# Patient Record
Sex: Female | Born: 1975 | Race: White | Hispanic: No | Marital: Married | Smoking: Never smoker
Health system: Southern US, Community
[De-identification: ages and names within clinical notes are randomized; demographics above are authoritative.]

## PROBLEM LIST (undated history)

## (undated) DIAGNOSIS — N926 Irregular menstruation, unspecified: Secondary | ICD-10-CM

## (undated) DIAGNOSIS — K589 Irritable bowel syndrome without diarrhea: Secondary | ICD-10-CM

## (undated) DIAGNOSIS — J302 Other seasonal allergic rhinitis: Secondary | ICD-10-CM

## (undated) DIAGNOSIS — E669 Obesity, unspecified: Secondary | ICD-10-CM

## (undated) HISTORY — DX: Irregular menstruation, unspecified: N92.6

## (undated) HISTORY — PX: LASIK: SHX215

## (undated) HISTORY — PX: CHOLECYSTECTOMY: SHX55

## (undated) HISTORY — DX: Obesity, unspecified: E66.9

---

## 2005-09-14 ENCOUNTER — Ambulatory Visit: Payer: Self-pay | Admitting: Family Medicine

## 2011-10-04 DIAGNOSIS — J309 Allergic rhinitis, unspecified: Secondary | ICD-10-CM | POA: Insufficient documentation

## 2011-10-04 DIAGNOSIS — R519 Headache, unspecified: Secondary | ICD-10-CM | POA: Insufficient documentation

## 2011-10-04 DIAGNOSIS — G4700 Insomnia, unspecified: Secondary | ICD-10-CM | POA: Insufficient documentation

## 2011-10-04 DIAGNOSIS — J302 Other seasonal allergic rhinitis: Secondary | ICD-10-CM | POA: Insufficient documentation

## 2012-05-19 ENCOUNTER — Encounter: Payer: Self-pay | Admitting: *Deleted

## 2012-05-19 ENCOUNTER — Emergency Department (INDEPENDENT_AMBULATORY_CARE_PROVIDER_SITE_OTHER)
Admission: EM | Admit: 2012-05-19 | Discharge: 2012-05-19 | Disposition: A | Discharge status: Home / Self Care | Payer: BC Managed Care – PPO | Source: Home / Self Care

## 2012-05-19 DIAGNOSIS — B351 Tinea unguium: Secondary | ICD-10-CM

## 2012-05-19 HISTORY — DX: Irritable bowel syndrome, unspecified: K58.9

## 2012-05-19 MED ORDER — TERBINAFINE HCL 250 MG PO TABS: 250.0000 mg | ORAL_TABLET | Freq: Every day | ORAL | Status: AC

## 2012-05-19 NOTE — ED Notes (Signed)
Patient c/o left great toe discoloration x 5 weeks. No injury, swelling pain, or drainage.

## 2012-05-19 NOTE — ED Provider Notes (Signed)
Agree with exam, assessment, and plan.   Gracey Tolle A Bronislaw Switzer, MD 05/19/12 2333 

## 2012-05-19 NOTE — ED Provider Notes (Signed)
History     CSN: [id number]  Arrival date & time 05/19/12  1402   First MD Initiated Contact with Patient 05/19/12 1407      Chief Complaint  Patient presents with  . Nail Problem    left great toe   HPI Toe nail fungus on R great toe x 5 weeks. Pt states that she had a pedicure done and noticed thickening of nail and color change 1-2 weeks later.  No purulent drainage, redness, swelling.  Pt states that she has been using OTC topical antifungals without any change in discoloration.  Discoloration has been mildly worsening.  Otherwise no issues with great toe.   Past Medical History  Diagnosis Date  . IBS (irritable bowel syndrome)     Past Surgical History  Procedure Date  . Lasik     Family History  Problem Relation Age of Onset  . Hypertension Mother   . Hyperlipidemia Father     History  Substance Use Topics  . Smoking status: Never Smoker   . Smokeless tobacco: Not on file  . Alcohol Use: No    OB History    Grav Para Term Preterm Abortions TAB SAB Ect Mult Living                  Review of Systems  All other systems reviewed and are negative.   See HPI, otherwise 12 point ROS negative.  Allergies  Codeine; Erythromycin; Penicillins; and Sulfa antibiotics  Home Medications   Current Outpatient Rx  Name Route Sig Dispense Refill  . LUBIPROSTONE 24 MCG PO CAPS Oral Take 24 mcg by mouth 2 (two) times daily with a meal.      BP 106/67  Pulse 66  Temp 98.6 F (37 C) (Oral)  Resp 14  Ht 5\' 7"  (1.702 m)  Wt 162 lb (73.483 kg)  BMI 25.37 kg/m2  SpO2 100%  Physical Exam  Constitutional: She appears well-developed and well-nourished.  HENT:  Head: Normocephalic.  Eyes: Conjunctivae are normal. Pupils are equal, round, and reactive to light.  Neck: Normal range of motion.  Cardiovascular: Normal rate and regular rhythm.   Pulmonary/Chest: Effort normal and breath sounds normal.  Abdominal: Soft. Bowel sounds are normal.  Genitourinary:  Vagina normal.  Musculoskeletal:       Feet:       L great toe toenail thickening and mild discoloration.     ED Course  Procedures (including critical care time)  Labs Reviewed - No data to display No results found.   1. Onychomycosis       MDM  L great toe toe nail involvement.  Will treat with oral terbinafine.  Reviewed LFTs from most recent physical exam at PCP 1 month ago. WNL.  Plan for follow up with PCP in 2-3 weeks for reevaluation.  General red flags reviewed.     The patient and/or caregiver has been counseled thoroughly with regard to treatment plan and/or medications prescribed including dosage, schedule, interactions, rationale for use, and possible side effects and they verbalize understanding. Diagnoses and expected course of recovery discussed and will return if not improved as expected or if the condition worsens. Patient and/or caregiver verbalized understanding.             Floydene Flock, MD 05/19/12 [phone]

## 2012-05-20 ENCOUNTER — Telehealth: Payer: Self-pay | Admitting: *Deleted

## 2012-06-27 LAB — HM PAP SMEAR: HM Pap smear: NEGATIVE

## 2013-01-13 DIAGNOSIS — Z9049 Acquired absence of other specified parts of digestive tract: Secondary | ICD-10-CM

## 2013-01-13 HISTORY — DX: Acquired absence of other specified parts of digestive tract: Z90.49

## 2014-08-12 ENCOUNTER — Emergency Department (INDEPENDENT_AMBULATORY_CARE_PROVIDER_SITE_OTHER)
Admission: EM | Admit: 2014-08-12 | Discharge: 2014-08-12 | Disposition: A | Discharge status: Home / Self Care | Payer: BC Managed Care – PPO | Source: Home / Self Care | Attending: Emergency Medicine | Admitting: Emergency Medicine

## 2014-08-12 ENCOUNTER — Emergency Department (INDEPENDENT_AMBULATORY_CARE_PROVIDER_SITE_OTHER): Payer: BC Managed Care – PPO

## 2014-08-12 ENCOUNTER — Encounter: Payer: Self-pay | Admitting: Emergency Medicine

## 2014-08-12 DIAGNOSIS — R05 Cough: Secondary | ICD-10-CM

## 2014-08-12 DIAGNOSIS — J209 Acute bronchitis, unspecified: Secondary | ICD-10-CM

## 2014-08-12 DIAGNOSIS — R059 Cough, unspecified: Secondary | ICD-10-CM

## 2014-08-12 DIAGNOSIS — M940 Chondrocostal junction syndrome [Tietze]: Secondary | ICD-10-CM

## 2014-08-12 HISTORY — DX: Other seasonal allergic rhinitis: J30.2

## 2014-08-12 MED ORDER — METHYLPREDNISOLONE ACETATE 80 MG/ML IJ SUSP
80.0000 mg | Freq: Once | INTRAMUSCULAR | Status: AC
  Administered 2014-08-12: 80 mg via INTRAMUSCULAR

## 2014-08-12 MED ORDER — BENZONATATE 200 MG PO CAPS: ORAL_CAPSULE | ORAL | Status: DC

## 2014-08-12 MED ORDER — FLUTICASONE PROPIONATE 50 MCG/ACT NA SUSP: NASAL | Status: DC

## 2014-08-12 MED ORDER — PREDNISONE (PAK) 10 MG PO TABS: ORAL_TABLET | ORAL | Status: DC

## 2014-08-12 MED ORDER — DOXYCYCLINE HYCLATE 100 MG PO CAPS: 100.0000 mg | ORAL_CAPSULE | Freq: Two times a day (BID) | ORAL | Status: DC

## 2014-08-12 NOTE — ED Provider Notes (Addendum)
CSN: [id number]     Arrival date & time 08/12/14  1708 History   First MD Initiated Contact with Patient 08/12/14 1713     Chief Complaint  Patient presents with  . Cough  . Nasal Congestion  . Fatigue  . Chest Pain  . Dizziness  . Code Stroke  . Night Sweats    HPI Reports 3 week history of cough, sharp pain in anterior chest when coughing, congestion, fatigue, headaches, dizziness.low-grade fever at times. No recent OTCs. Cough occasionally productive of scant sputum slightly discolored without blood. Mild discolored nasal rhinorrhea. Some pleuritic anterior chest discomfort as mild, sharp. Occasionally feels mild wheezing. No history of asthma or chronic lung disease. No exertional chest pain. No nausea or vomiting. No definite reflux symptoms. She tried Zantac without help. Tried Claritin without improvement. Past Medical History  Diagnosis Date  . IBS (irritable bowel syndrome)   . Seasonal allergies    Past Surgical History  Procedure Laterality Date  . Lasik    . Cholecystectomy     Family History  Problem Relation Age of Onset  . Hypertension Mother   . Hyperlipidemia Father    History  Substance Use Topics  . Smoking status: Never Smoker   . Smokeless tobacco: Not on file  . Alcohol Use: No   OB History    No data available     Review of Systems  All other systems reviewed and are negative.   Allergies  Codeine; Erythromycin; Penicillins; and Sulfa antibiotics  Home Medications   Prior to Admission medications   Medication Sig Start Date End Date Taking? Authorizing Provider  benzonatate (TESSALON) 200 MG capsule Take 1 every 8 hours as needed for cough. 08/12/14   Lajean Manesavid Massey, MD  doxycycline (VIBRAMYCIN) 100 MG capsule Take 1 capsule (100 mg total) by mouth 2 (two) times daily. 08/12/14   Lajean Manesavid Massey, MD  fluticasone Aleda Grana(FLONASE) 50 MCG/ACT nasal spray 1 or 2 sprays each nostril twice a day 08/12/14   Lajean Manesavid Massey, MD  lubiprostone (AMITIZA) 24 MCG  capsule Take 24 mcg by mouth 2 (two) times daily with a meal.    Historical Provider, MD  predniSONE (STERAPRED UNI-PAK) 10 MG tablet Take as directed for 6 days.--Take 6 on day 1, 5 on day 2, 4 on day 3, then 3 tablets on day 4, then 2 tablets on day 5, then 1 on day 6. 08/12/14   Lajean Manesavid Massey, MD   BP 119/72 mmHg  Pulse 100  Temp(Src) 98.6 F (37 C)  Resp 16  Ht 5' 6.5" (1.689 m)  Wt 165 lb (74.844 kg)  BMI 26.24 kg/m2  SpO2 100% Physical Exam  Constitutional: She is oriented to person, place, and time. She appears well-developed and well-nourished. No distress.  HENT:  Head: Normocephalic and atraumatic.  Right Ear: Tympanic membrane, external ear and ear canal normal.  Left Ear: Tympanic membrane, external ear and ear canal normal.  Nose: Mucosal edema and rhinorrhea present. Right sinus exhibits maxillary sinus tenderness. Left sinus exhibits maxillary sinus tenderness.  Mouth/Throat: Oropharynx is clear and moist. No oral lesions. No oropharyngeal exudate.  Eyes: Right eye exhibits no discharge. Left eye exhibits no discharge. No scleral icterus.  Neck: Neck supple.  Cardiovascular: Normal rate, regular rhythm and normal heart sounds.   Pulmonary/Chest: Effort normal. No respiratory distress. She has wheezes (few scattered late expiratory). She has rhonchi. She has no rales.  Lymphadenopathy:    She has no cervical adenopathy.  Neurological: She is  alert and oriented to person, place, and time.  Skin: Skin is warm and dry.  Psychiatric: She has a normal mood and affect.  Nursing note and vitals reviewed.   ED Course  Procedures (including critical care time) Labs Review Labs Reviewed - No data to display  Imaging Review Dg Chest 2 View  08/12/2014   CLINICAL DATA:  Three-week history of dry cough.  Nonsmoker.  EXAM: CHEST  2 VIEW  COMPARISON:  None.  FINDINGS: The heart size and mediastinal contours are within normal limits. Both lungs are clear. No pleural effusion or  pneumothorax. The visualized skeletal structures are unremarkable.  IMPRESSION: No active cardiopulmonary disease.   Electronically Signed   By: Amie Portlandavid  Ormond M.D.   On: 08/12/2014 17:53     MDM   1. Acute bronchitis with bronchospasm   2. Cough   3. Costochondritis    Treatment options discussed, as well as risks, benefits, alternatives. Patient voiced understanding and agreement with the following plans:  After discussion, she prefers Depo-Medrol 80 mg IM now. She prefers that I electronically send her prescriptions to her pharmacy, which I sent: Discharge Medication List as of 08/12/2014  6:25 PM    START taking these medications   Details  benzonatate (TESSALON) 200 MG capsule Take 1 every 8 hours as needed for cough., Normal    doxycycline (VIBRAMYCIN) 100 MG capsule Take 1 capsule (100 mg total) by mouth 2 (two) times daily., Starting 08/12/2014, Until Discontinued, Normal    fluticasone (FLONASE) 50 MCG/ACT nasal spray 1 or 2 sprays each nostril twice a day, Normal    predniSONE (STERAPRED UNI-PAK) 10 MG tablet Take as directed for 6 days.--Take 6 on day 1, 5 on day 2, 4 on day 3, then 3 tablets on day 4, then 2 tablets on day 5, then 1 on day 6., Normal       She declined any bronchodilator. We discussed other symptomatic care. Follow-up with your primary care doctor in 5-7 days if not improving, or sooner if symptoms become worse. Precautions discussed. Red flags discussed. Questions invited and answered. Patient voiced understanding and agreement.    Lajean Manesavid Massey, MD 08/12/14 1913  Lajean Manesavid Massey, MD 08/12/14 Prentice [id number]  Lajean Manesavid Massey, MD 08/12/14 [phone]

## 2014-08-12 NOTE — ED Notes (Signed)
Reports 3 week history of cough, pain in chest when coughing, congestion, fatigue, headaches, dizziness. No fever. No recent OTCs.

## 2014-08-21 ENCOUNTER — Encounter: Payer: Self-pay | Admitting: *Deleted

## 2014-08-21 ENCOUNTER — Emergency Department (INDEPENDENT_AMBULATORY_CARE_PROVIDER_SITE_OTHER)
Admission: EM | Admit: 2014-08-21 | Discharge: 2014-08-21 | Disposition: A | Discharge status: Home / Self Care | Payer: BC Managed Care – PPO | Source: Home / Self Care | Attending: Family Medicine | Admitting: Family Medicine

## 2014-08-21 DIAGNOSIS — H8110 Benign paroxysmal vertigo, unspecified ear: Secondary | ICD-10-CM

## 2014-08-21 MED ORDER — MECLIZINE HCL 25 MG PO TABS: ORAL_TABLET | ORAL | Status: DC

## 2014-08-21 NOTE — Discharge Instructions (Signed)
May add Sudafed for sinus congestion.    May use Afrin nasal spray (or generic oxymetazoline) twice daily for about 5 days.  Also recommend using saline nasal spray several times daily and saline nasal irrigation (AYR is a common brand).  Continue Flonase nasal spray. Finish antibiotic. Stop all antihistamines for now, and other non-prescription cough/cold preparations.

## 2014-08-21 NOTE — ED Provider Notes (Signed)
CSN: [id number]     Arrival date & time 08/21/14  1743 History   First MD Initiated Contact with Patient 08/21/14 1814     Chief Complaint  Patient presents with  . Dizziness  . Headache      HPI Comments: Patient was prescribed doxycycline and prednisone 10 days ago for bronchitis.  Her symptoms have generally improved, but over the past 48 hours she has felt dizzy (head spinning) with movement, and her left ear feels clogged.  She has had occasional nausea without vomiting.  She had left ear surgery two years ago.  No fevers, chills, and sweats  Patient is a 38 y.o. female presenting with dizziness. The history is provided by the patient.  Dizziness Quality:  Head spinning and imbalance Severity:  Mild Onset quality:  Sudden Duration:  2 days Timing:  Intermittent Progression:  Unchanged Chronicity:  New Context: bending over and head movement   Context: not with ear pain   Relieved by:  Nothing Ineffective treatments:  None tried Associated symptoms: headaches and nausea   Associated symptoms: no hearing loss, no shortness of breath, no syncope, no tinnitus, no vision changes, no vomiting and no weakness     Past Medical History  Diagnosis Date  . IBS (irritable bowel syndrome)   . Seasonal allergies    Past Surgical History  Procedure Laterality Date  . Lasik    . Cholecystectomy     Family History  Problem Relation Age of Onset  . Hypertension Mother   . Hyperlipidemia Father    History  Substance Use Topics  . Smoking status: Never Smoker   . Smokeless tobacco: Not on file  . Alcohol Use: Yes   OB History    No data available     Review of Systems  HENT: Negative for hearing loss and tinnitus.   Respiratory: Negative for shortness of breath.   Cardiovascular: Negative for syncope.  Gastrointestinal: Positive for nausea. Negative for vomiting.  Neurological: Positive for dizziness and headaches.  All other systems reviewed and are negative.   Allergies  Codeine; Erythromycin; Penicillins; and Sulfa antibiotics  Home Medications   Prior to Admission medications   Medication Sig Start Date End Date Taking? Authorizing Provider  benzonatate (TESSALON) 200 MG capsule Take 1 every 8 hours as needed for cough. 08/12/14   Lajean Manesavid Massey, MD  doxycycline (VIBRAMYCIN) 100 MG capsule Take 1 capsule (100 mg total) by mouth  2 (two) times daily. 08/12/14   Lajean Manesavid Massey, MD  fluticasone Aleda Grana(FLONASE) 50 MCG/ACT nasal spray 1 or 2 sprays each nostril twice a day 08/12/14   Lajean Manesavid Massey, MD  lubiprostone (AMITIZA) 24 MCG capsule Take 24 mcg by mouth 2 (two) times daily with a meal.    Historical Provider, MD  meclizine (ANTIVERT) 25 MG tablet Take one tab by mouth 2 or 3 times daily as needed for dizziness 08/21/14   Lattie HawStephen A Beese, MD  predniSONE (STERAPRED UNI-PAK) 10 MG tablet Take as directed for 6 days.--Take 6 on day 1, 5 on day 2, 4 on day 3, then 3 tablets on day 4, then 2 tablets on day 5, then 1 on day 6. 08/12/14   Lajean Manesavid Massey, MD   BP 117/82 mmHg  Pulse 92  Temp(Src) 98.3 F (36.8 C) (Oral)  Resp 18  Ht 5\' 7"  (1.702 m)  Wt 173 lb (78.472 kg)  BMI 27.09 kg/m2  SpO2 100% Physical Exam Nursing notes and Vital Signs reviewed. Appearance:  Patient appears healthy, stated age, and in no acute distress Eyes:  Pupils are equal, round, and reactive to light and accomodation.  Extraocular movement is intact.  Conjunctivae are not inflamed.  Fundi benign.  Slight nystagmus present on lateral gaze. Ears:  Canals normal.  Right tympanic membrane normal.  Left tympanic membrane scarred and retracted. Nose:  Mildly congested turbinates.  No sinus tenderness.    Pharynx:  Normal Neck:  Supple.  No adenopathy Skin:  No rash present.    Neurologic:  Cranial nerves 2 through 12 are normal. Gait and station are normal.   ED Course  Procedures  None  Labs Reviewed -  Tympanogram normal bilaterally       MDM   1. Benign paroxysmal positional vertigo, unspecified laterality    Begin Antivert May add Sudafed for sinus congestion.    May use Afrin nasal spray (or generic oxymetazoline) twice daily for about 5 days.  Also recommend using saline nasal spray several times daily and saline nasal irrigation (AYR is a common brand).  Continue Flonase nasal spray. Finish antibiotic. Stop all antihistamines for now, and  other non-prescription cough/cold preparations. Followup ENT if not improving    Lattie HawStephen A Beese, MD 08/23/14 1515

## 2014-08-21 NOTE — ED Notes (Signed)
Pt c/o HA, dizziness, and LT ear echoing x 2 days. She reports a hx of vertigo.

## 2015-05-24 ENCOUNTER — Encounter: Payer: Self-pay | Admitting: Osteopathic Medicine

## 2015-05-24 ENCOUNTER — Ambulatory Visit (INDEPENDENT_AMBULATORY_CARE_PROVIDER_SITE_OTHER): Payer: BC Managed Care – PPO | Admitting: Osteopathic Medicine

## 2015-05-24 VITALS — BP 93/60 | HR 68 | Ht 66.0 in | Wt 186.0 lb

## 2015-05-24 DIAGNOSIS — L680 Hirsutism: Secondary | ICD-10-CM | POA: Diagnosis not present

## 2015-05-24 DIAGNOSIS — N951 Menopausal and female climacteric states: Secondary | ICD-10-CM

## 2015-05-24 DIAGNOSIS — N926 Irregular menstruation, unspecified: Secondary | ICD-10-CM | POA: Diagnosis not present

## 2015-05-24 DIAGNOSIS — R5383 Other fatigue: Secondary | ICD-10-CM

## 2015-05-24 DIAGNOSIS — R232 Flushing: Secondary | ICD-10-CM

## 2015-05-24 DIAGNOSIS — S83209A Unspecified tear of unspecified meniscus, current injury, unspecified knee, initial encounter: Secondary | ICD-10-CM | POA: Insufficient documentation

## 2015-05-24 DIAGNOSIS — R635 Abnormal weight gain: Secondary | ICD-10-CM | POA: Diagnosis not present

## 2015-05-24 DIAGNOSIS — L708 Other acne: Secondary | ICD-10-CM

## 2015-05-24 MED ORDER — BENZOYL PEROXIDE 5 % EX LOTN: TOPICAL_LOTION | CUTANEOUS | Status: DC

## 2015-05-24 NOTE — Progress Notes (Signed)
HPI:  Kathleen Ortega is a 39 y.o. female who presents to [facility]  today for weight gain 34 lbs over past year despite reportedly healthy diet and exercise habits.  Assoc symptoms: C/o hair growth on breast/chest/neck, acne, fatigue  Onset/duration: ongoing for about 6 mos. Mirena removed 02/2015 - symptoms seem to have gotten worse since Mirena out. Made worse by: Mirena out, Periods seem erratic since Mirena out but usually between 22-31 days but has only had 2 periods, heavy, last 2 - 3 days. She had no periods on Mirena. Husband has vasectomy. Helped by: nothing. On Doxy for acne, doesn't seem to be helping.  No FH early menopause.   Previously worked up by other PCP but was unhappy with communication issues re: lab results and wants to establish care here.     Past medical, social and family history reviewed: Past Medical History  Diagnosis Date  . IBS (irritable bowel syndrome)   . Seasonal allergies    Past Surgical History  Procedure Laterality Date  . Lasik    . Cholecystectomy     Social History  Substance Use Topics  . Smoking status: Never Smoker   . Smokeless tobacco: Not on file  . Alcohol Use: Yes   Family History  Problem Relation Age of Onset  . Hypertension Mother   . Hyperlipidemia Father   . Alcohol abuse Brother   . Diabetes Maternal Grandmother   . Stroke Maternal Grandfather   . Cancer Paternal Grandmother     breast    Current Outpatient Prescriptions  Medication Sig Dispense Refill  . ALPRAZolam (XANAX) 0.5 MG tablet Take 0.5 mg by mouth as needed for anxiety.    . minocycline (DYNACIN) 50 MG tablet Take 50 mg by mouth daily.    . Benzoyl Peroxide 5 % lotion APPLY TOPICALLY TO AFFECTED AREA EVERY NIGHT, WASH OFF IN MORNING. NOTE - MAY BLEACH CLOTHING/LINENS 30 mL 0   No current facility-administered medications for this visit.   Allergies  Allergen Reactions  . Codeine   . Erythromycin   . Penicillins    . Sulfa Antibiotics      Review of Systems: CONSTITUTIONAL: Neg fever/chills, (+) unintentional weight gain HEAD/EYES/EARS/NOSE: No headache/vision change or hearing change CARDIAC: No chest pain/pressure/palpitations, no orthopnea RESPIRATORY: No cough/shortness of breath/wheeze GASTROINTESTINAL: No nausea/vomiting/abdominal pain/blood in stool/diarrhea/constipation MUSCULOSKELETAL: No myalgia/arthralgia GENITOURINARY: No incontinence, No abnormal genital bleeding/discharge, thinks periods are too close together SKIN: No rash/wounds/concerning lesions. (+) acne and hair grown as per HPI HEM/ONC: No easy bruising/bleeding, no abnormal lymph node PSYCHIATRIC: No concerns with depression/anxiety or sleep problems  ENDOCRINE: No heat/cold intolerance, no DM or Thyroid problems, (+) night sweats occasionally   Exam:  BP 93/60 mmHg  Pulse 68  Ht  (1.676 m)  Wt 186 lb (84.369 kg)  BMI 30.04 kg/m2  LMP 05/24/2015 Constitutional: VSS, see above. General Appearance: alert, well-developed, well-nourished, NAD Eyes: Normal lids and conjunctive, non-icteric sclera, PERRLA Ears, Nose, Mouth, Throat: Normal external inspection ears/nares/mouth/lips/gums, Normal TM bilaterally, MMM, posterior pharynx without erythema/exudate Neck: No masses, trachea midline. No thyroid enlargement/tenderness/mass appreciated Respiratory: Normal respiratory effort. No dullness/hyper-resonance to percussion. Breath sounds normal, no wheeze/rhonchi/rales Cardiovascular: S1/S2 normal, no murmur/rub/gallop auscultated. No carotid bruit or JVD. No abdominal aortic bruit. Pedal pulse II/IV bilaterally DP and PT. No lower extremity edema. Gastrointestinal: Nontender, no masses. No hepatomegaly, no splenomegaly. No hernia appreciated. Rectal exam deferred.  Musculoskeletal: Gait normal. No clubbing/cyanosis of digits.  Neurological:  No cranial nerve deficit on limited exam. Motor and sensation intact and  symmetric Skin: warm, dry, no rashes/ulcers, (+) acne vulgaris on face, chest, shoulders - mild/moderate. No nodules/induration. No striae or abnormal fat deposits.  Psychiatric: Normal judgment/insight. Normal mood and affect. Oriented x3.    No results found for this or any previous visit (from the past 72 hour(s)). No results found. Lab results reviewed from [facility] 05/09/15: endocrine labs were done in afternoon but all in normal range. CMP, CBC, Lipid, TSH, Insulin, Iron/anemia profile, adrenal labs, all WNL. Vit D mild lower.     ASSESSMENT/PLAN:  Weight gain - Plan: labs as below. Diet/exercise counseling provided   Hirsutism - Plan: DHEA-sulfate, 17-Hydroxyprogesterone, Prolactin, Testosterone, Free, Total, SHBG  Other fatigue - labs reviewed, normal TSH and CBC  Irregular menstrual cycle - Plan: DHEA-sulfate, FSH/LH, US Pelvis Complete, Pregnancy, urine  Hot flashes - Plan: DHEA-sulfate, FSH/LH  Other acne - Plan: Benzoyl Peroxide 5 % lotion, can continue Doxy as well      Note: Consider PCOS - [place] Criteria (2 of the following) Oligo/anovulation in absence of other cause - difficult to determine given she has only had 2 periods since removal of Mirena.  Hypoerandrogenism (clinical or biochemical) - (+) w/ acne, hirsutism but (-) labs will recollect and patient instructed to have these labs drawn close as possible to 8:00 am Polycystic ovaries by [place] - WILL ORDER  Note: low suspicion for Cushing's or other adrenal disorder given symptoms

## 2015-05-24 NOTE — Patient Instructions (Signed)
Preventive Care for Adults A healthy lifestyle and preventive care can promote health and wellness. Preventive health guidelines for women include the following key practices.  A routine yearly physical is a good way to check with your health care provider about your health and preventive screening. It is a chance to share any concerns and updates on your health and to receive a thorough exam.  Visit your dentist for a routine exam and preventive care every 6 months. Brush your teeth twice a day and floss once a day. Good oral hygiene prevents tooth decay and gum disease.  The frequency of eye exams is based on your age, health, family medical history, use of contact lenses, and other factors. Follow your health care provider's recommendations for frequency of eye exams.  Eat a healthy diet. Foods like vegetables, fruits, whole grains, low-fat dairy products, and lean protein foods contain the nutrients you need without too many calories. Decrease your intake of foods high in solid fats, added sugars, and salt. Eat the right amount of calories for you.Get information about a proper diet from your health care provider, if necessary.  Regular physical exercise is one of the most important things you can do for your health. Most adults should get at least 150 minutes of moderate-intensity exercise (any activity that increases your heart rate and causes you to sweat) each week. In addition, most adults need muscle-strengthening exercises on 2 or more days a week.  Maintain a healthy weight. The body mass index (BMI) is a screening tool to identify possible weight problems. It provides an estimate of body fat based on height and weight. Your health care provider can find your BMI and can help you achieve or maintain a healthy weight.For adults 20 years and older:  A BMI below 18.5 is considered underweight.  A BMI of 18.5 to 24.9 is normal.  A BMI of 25 to 29.9 is considered overweight.  A BMI of  30 and above is considered obese.  Maintain normal blood lipids and cholesterol levels by exercising and minimizing your intake of saturated fat. Eat a balanced diet with plenty of fruit and vegetables. Blood tests for lipids and cholesterol should begin at age 7 and be repeated every 5 years. If your lipid or cholesterol levels are high, you are over 50, or you are at high risk for heart disease, you may need your cholesterol levels checked more frequently.Ongoing high lipid and cholesterol levels should be treated with medicines if diet and exercise are not working.  If you smoke, find out from your health care provider how to quit. If you do not use tobacco, do not start.  Lung cancer screening is recommended for adults aged 24-80 years who are at high risk for developing lung cancer because of a history of smoking. A yearly low-dose CT scan of the lungs is recommended for people who have at least a 30-pack-year history of smoking and are a current smoker or have quit within the past 15 years. A pack year of smoking is smoking an average of 1 pack of cigarettes a day for 1 year (for example: 1 pack a day for 30 years or 2 packs a day for 15 years). Yearly screening should continue until the smoker has stopped smoking for at least 15 years. Yearly screening should be stopped for people who develop a health problem that would prevent them from having lung cancer treatment.  If you are pregnant, do not drink alcohol. If you are breastfeeding,  be very cautious about drinking alcohol. If you are not pregnant and choose to drink alcohol, do not have more than 1 drink per day. One drink is considered to be 12 ounces (355 mL) of beer, 5 ounces (148 mL) of wine, or 1.5 ounces (44 mL) of liquor.  Avoid use of street drugs. Do not share needles with anyone. Ask for help if you need support or instructions about stopping the use of drugs.  High blood pressure causes heart disease and increases the risk of  stroke. Your blood pressure should be checked at least every 1 to 2 years. Ongoing high blood pressure should be treated with medicines if weight loss and exercise do not work.  If you are 26-3 years old, ask your health care provider if you should take aspirin to prevent strokes.  Diabetes screening involves taking a blood sample to check your fasting blood sugar level. This should be done once every 3 years, after age 52, if you are within normal weight and without risk factors for diabetes. Testing should be considered at a younger age or be carried out more frequently if you are overweight and have at least 1 risk factor for diabetes.  Breast cancer screening is essential preventive care for women. You should practice "breast self-awareness." This means understanding the normal appearance and feel of your breasts and may include breast self-examination. Any changes detected, no matter how small, should be reported to a health care provider. Women in their 62s and 30s should have a clinical breast exam (CBE) by a health care provider as part of a regular health exam every 1 to 3 years. After age 3, women should have a CBE every year. Starting at age 88, women should consider having a mammogram (breast X-ray test) every year. Women who have a family history of breast cancer should talk to their health care provider about genetic screening. Women at a high risk of breast cancer should talk to their health care providers about having an MRI and a mammogram every year.  Breast cancer gene (BRCA)-related cancer risk assessment is recommended for women who have family members with BRCA-related cancers. BRCA-related cancers include breast, ovarian, tubal, and peritoneal cancers. Having family members with these cancers may be associated with an increased risk for harmful changes (mutations) in the breast cancer genes BRCA1 and BRCA2. Results of the assessment will determine the need for genetic counseling and  BRCA1 and BRCA2 testing.  Routine pelvic exams to screen for cancer are no longer recommended for nonpregnant women who are considered low risk for cancer of the pelvic organs (ovaries, uterus, and vagina) and who do not have symptoms. Ask your health care provider if a screening pelvic exam is right for you.  If you have had past treatment for cervical cancer or a condition that could lead to cancer, you need Pap tests and screening for cancer for at least 20 years after your treatment. If Pap tests have been discontinued, your risk factors (such as having a new sexual partner) need to be reassessed to determine if screening should be resumed. Some women have medical problems that increase the chance of getting cervical cancer. In these cases, your health care provider may recommend more frequent screening and Pap tests.  The HPV test is an additional test that may be used for cervical cancer screening. The HPV test looks for the virus that can cause the cell changes on the cervix. The cells collected during the Pap test can be  tested for HPV. The HPV test could be used to screen women aged 38 years and older, and should be used in women of any age who have unclear Pap test results. After the age of 79, women should have HPV testing at the same frequency as a Pap test.  Colorectal cancer can be detected and often prevented. Most routine colorectal cancer screening begins at the age of 101 years and continues through age 70 years. However, your health care provider may recommend screening at an earlier age if you have risk factors for colon cancer. On a yearly basis, your health care provider may provide home test kits to check for hidden blood in the stool. Use of a small camera at the end of a tube, to directly examine the colon (sigmoidoscopy or colonoscopy), can detect the earliest forms of colorectal cancer. Talk to your health care provider about this at age 50, when routine screening begins. Direct  exam of the colon should be repeated every 5-10 years through age 74 years, unless early forms of pre-cancerous polyps or small growths are found.  People who are at an increased risk for hepatitis B should be screened for this virus. You are considered at high risk for hepatitis B if:  You were born in a country where hepatitis B occurs often. Talk with your health care provider about which countries are considered high risk.  Your parents were born in a high-risk country and you have not received a shot to protect against hepatitis B (hepatitis B vaccine).  You have HIV or AIDS.  You use needles to inject street drugs.  You live with, or have sex with, someone who has hepatitis B.  You get hemodialysis treatment.  You take certain medicines for conditions like cancer, organ transplantation, and autoimmune conditions.  Hepatitis C blood testing is recommended for all people born from [birth year] through 1965 and any individual with known risks for hepatitis C.  Practice safe sex. Use condoms and avoid high-risk sexual practices to reduce the spread of sexually transmitted infections (STIs). STIs include gonorrhea, chlamydia, syphilis, trichomonas, herpes, HPV, and human immunodeficiency virus (HIV). Herpes, HIV, and HPV are viral illnesses that have no cure. They can result in disability, cancer, and death.  You should be screened for sexually transmitted illnesses (STIs) including gonorrhea and chlamydia if:  You are sexually active and are younger than 24 years.  You are older than 24 years and your health care provider tells you that you are at risk for this type of infection.  Your sexual activity has changed since you were last screened and you are at an increased risk for chlamydia or gonorrhea. Ask your health care provider if you are at risk.  If you are at risk of being infected with HIV, it is recommended that you take a prescription medicine daily to prevent HIV infection. This is  called preexposure prophylaxis (PrEP). You are considered at risk if:  You are a heterosexual woman, are sexually active, and are at increased risk for HIV infection.  You take drugs by injection.  You are sexually active with a partner who has HIV.  Talk with your health care provider about whether you are at high risk of being infected with HIV. If you choose to begin PrEP, you should first be tested for HIV. You should then be tested every 3 months for as long as you are taking PrEP.  Osteoporosis is a disease in which the bones lose minerals and strength  with aging. This can result in serious bone fractures or breaks. The risk of osteoporosis can be identified using a bone density scan. Women ages 26 years and over and women at risk for fractures or osteoporosis should discuss screening with their health care providers. Ask your health care provider whether you should take a calcium supplement or vitamin D to reduce the rate of osteoporosis.  Menopause can be associated with physical symptoms and risks. Hormone replacement therapy is available to decrease symptoms and risks. You should talk to your health care provider about whether hormone replacement therapy is right for you.  Use sunscreen. Apply sunscreen liberally and repeatedly throughout the day. You should seek shade when your shadow is shorter than you. Protect yourself by wearing long sleeves, pants, a wide-brimmed hat, and sunglasses year round, whenever you are outdoors.  Once a month, do a whole body skin exam, using a mirror to look at the skin on your back. Tell your health care provider of new moles, moles that have irregular borders, moles that are larger than a pencil eraser, or moles that have changed in shape or color.  Stay current with required vaccines (immunizations).  Influenza vaccine. All adults should be immunized every year.  Tetanus, diphtheria, and acellular pertussis (Td, Tdap) vaccine. Pregnant women should  receive 1 dose of Tdap vaccine during each pregnancy. The dose should be obtained regardless of the length of time since the last dose. Immunization is preferred during the 27th-36th week of gestation. An adult who has not previously received Tdap or who does not know her vaccine status should receive 1 dose of Tdap. This initial dose should be followed by tetanus and diphtheria toxoids (Td) booster doses every 10 years. Adults with an unknown or incomplete history of completing a 3-dose immunization series with Td-containing vaccines should begin or complete a primary immunization series including a Tdap dose. Adults should receive a Td booster every 10 years.  Varicella vaccine. An adult without evidence of immunity to varicella should receive 2 doses or a second dose if she has previously received 1 dose. Pregnant females who do not have evidence of immunity should receive the first dose after pregnancy. This first dose should be obtained before leaving the health care facility. The second dose should be obtained 4-8 weeks after the first dose.  Human papillomavirus (HPV) vaccine. Females aged 13-26 years who have not received the vaccine previously should obtain the 3-dose series. The vaccine is not recommended for use in pregnant females. However, pregnancy testing is not needed before receiving a dose. If a female is found to be pregnant after receiving a dose, no treatment is needed. In that case, the remaining doses should be delayed until after the pregnancy. Immunization is recommended for any person with an immunocompromised condition through the age of 19 years if she did not get any or all doses earlier. During the 3-dose series, the second dose should be obtained 4-8 weeks after the first dose. The third dose should be obtained 24 weeks after the first dose and 16 weeks after the second dose.  Zoster vaccine. One dose is recommended for adults aged 35 years or older unless certain conditions are  present.  Measles, mumps, and rubella (MMR) vaccine. Adults born before [birth year] generally are considered immune to measles and mumps. Adults born in [birth year] or later should have 1 or more doses of MMR vaccine unless there is a contraindication to the vaccine or there is laboratory evidence of immunity to  each of the three diseases. A routine second dose of MMR vaccine should be obtained at least 28 days after the first dose for students attending postsecondary schools, health care workers, or international travelers. People who received inactivated measles vaccine or an unknown type of measles vaccine during 1963-1967 should receive 2 doses of MMR vaccine. People who received inactivated mumps vaccine or an unknown type of mumps vaccine before 1979 and are at high risk for mumps infection should consider immunization with 2 doses of MMR vaccine. For females of childbearing age, rubella immunity should be determined. If there is no evidence of immunity, females who are not pregnant should be vaccinated. If there is no evidence of immunity, females who are pregnant should delay immunization until after pregnancy. Unvaccinated health care workers born before [birth year] who lack laboratory evidence of measles, mumps, or rubella immunity or laboratory confirmation of disease should consider measles and mumps immunization with 2 doses of MMR vaccine or rubella immunization with 1 dose of MMR vaccine.  Pneumococcal 13-valent conjugate (PCV13) vaccine. When indicated, a person who is uncertain of her immunization history and has no record of immunization should receive the PCV13 vaccine. An adult aged 64 years or older who has certain medical conditions and has not been previously immunized should receive 1 dose of PCV13 vaccine. This PCV13 should be followed with a dose of pneumococcal polysaccharide (PPSV23) vaccine. The PPSV23 vaccine dose should be obtained at least 8 weeks after the dose of PCV13 vaccine. An adult aged 30  years or older who has certain medical conditions and previously received 1 or more doses of PPSV23 vaccine should receive 1 dose of PCV13. The PCV13 vaccine dose should be obtained 1 or more years after the last PPSV23 vaccine dose.  Pneumococcal polysaccharide (PPSV23) vaccine. When PCV13 is also indicated, PCV13 should be obtained first. All adults aged 12 years and older should be immunized. An adult younger than age 65 years who has certain medical conditions should be immunized. Any person who resides in a nursing home or long-term care facility should be immunized. An adult smoker should be immunized. People with an immunocompromised condition and certain other conditions should receive both PCV13 and PPSV23 vaccines. People with human immunodeficiency virus (HIV) infection should be immunized as soon as possible after diagnosis. Immunization during chemotherapy or radiation therapy should be avoided. Routine use of PPSV23 vaccine is not recommended for American Indians, [place]atives, or people younger than 65 years unless there are medical conditions that require PPSV23 vaccine. When indicated, people who have unknown immunization and have no record of immunization should receive PPSV23 vaccine. One-time revaccination 5 years after the first dose of PPSV23 is recommended for people aged 19-64 years who have chronic kidney failure, nephrotic syndrome, asplenia, or immunocompromised conditions. People who received 1-2 doses of PPSV23 before age 78 years should receive another dose of PPSV23 vaccine at age 41 years or later if at least 5 years have passed since the previous dose. Doses of PPSV23 are not needed for people immunized with PPSV23 at or after age 63 years.  Meningococcal vaccine. Adults with asplenia or persistent complement component deficiencies should receive 2 doses of quadrivalent meningococcal conjugate (MenACWY-D) vaccine. The doses should be obtained at least 2 months apart.  Microbiologists working with certain meningococcal bacteria, [employer] recruits, people at risk during an outbreak, and people who travel to or live in countries with a high rate of meningitis should be immunized. A first-year college student up through age  21 years who is living in a residence hall should receive a dose if she did not receive a dose on or after her 16th birthday. Adults who have certain high-risk conditions should receive one or more doses of vaccine.  Hepatitis A vaccine. Adults who wish to be protected from this disease, have certain high-risk conditions, work with hepatitis A-infected animals, work in hepatitis A research labs, or travel to or work in countries with a high rate of hepatitis A should be immunized. Adults who were previously unvaccinated and who anticipate close contact with an international adoptee during the first 60 days after arrival in the [place] from a country with a high rate of hepatitis A should be immunized.  Hepatitis B vaccine. Adults who wish to be protected from this disease, have certain high-risk conditions, may be exposed to blood or other infectious body fluids, are household contacts or sex partners of hepatitis B positive people, are clients or workers in certain care facilities, or travel to or work in countries with a high rate of hepatitis B should be immunized.  Haemophilus influenzae type b (Hib) vaccine. A previously unvaccinated person with asplenia or sickle cell disease or having a scheduled splenectomy should receive 1 dose of Hib vaccine. Regardless of previous immunization, a recipient of a hematopoietic stem cell transplant should receive a 3-dose series 6-12 months after her successful transplant. Hib vaccine is not recommended for adults with HIV infection. Preventive Services / Frequency Ages 64 to 68 years  Blood pressure check.** / Every 1 to 2 years.  Lipid and cholesterol check.** / Every 5 years beginning at age  22.  Clinical breast exam.** / Every 3 years for women in their 88s and 53s.  BRCA-related cancer risk assessment.** / For women who have family members with a BRCA-related cancer (breast, ovarian, tubal, or peritoneal cancers).  Pap test.** / Every 2 years from ages 90 through 51. Every 3 years starting at age 21 through age 56 or 3 with a history of 3 consecutive normal Pap tests.  HPV screening.** / Every 3 years from ages 24 through ages 1 to 46 with a history of 3 consecutive normal Pap tests.  Hepatitis C blood test.** / For any individual with known risks for hepatitis C.  Skin self-exam. / Monthly.  Influenza vaccine. / Every year.  Tetanus, diphtheria, and acellular pertussis (Tdap, Td) vaccine.** / Consult your health care provider. Pregnant women should receive 1 dose of Tdap vaccine during each pregnancy. 1 dose of Td every 10 years.  Varicella vaccine.** / Consult your health care provider. Pregnant females who do not have evidence of immunity should receive the first dose after pregnancy.  HPV vaccine. / 3 doses over 6 months, if 72 and younger. The vaccine is not recommended for use in pregnant females. However, pregnancy testing is not needed before receiving a dose.  Measles, mumps, rubella (MMR) vaccine.** / You need at least 1 dose of MMR if you were born in 1957 or later. You may also need a 2nd dose. For females of childbearing age, rubella immunity should be determined. If there is no evidence of immunity, females who are not pregnant should be vaccinated. If there is no evidence of immunity, females who are pregnant should delay immunization until after pregnancy.  Pneumococcal 13-valent conjugate (PCV13) vaccine.** / Consult your health care provider.  Pneumococcal polysaccharide (PPSV23) vaccine.** / 1 to 2 doses if you smoke cigarettes or if you have certain conditions.  Meningococcal vaccine.** /  1 dose if you are age 19 to 21 years and a first-year college  student living in a residence hall, or have one of several medical conditions, you need to get vaccinated against meningococcal disease. You may also need additional booster doses.  Hepatitis A vaccine.** / Consult your health care provider.  Hepatitis B vaccine.** / Consult your health care provider.  Haemophilus influenzae type b (Hib) vaccine.** / Consult your health care provider. Ages 40 to 64 years  Blood pressure check.** / Every 1 to 2 years.  Lipid and cholesterol check.** / Every 5 years beginning at age 20 years.  Lung cancer screening. / Every year if you are aged 55-80 years and have a 30-pack-year history of smoking and currently smoke or have quit within the past 15 years. Yearly screening is stopped once you have quit smoking for at least 15 years or develop a health problem that would prevent you from having lung cancer treatment.  Clinical breast exam.** / Every year after age 40 years.  BRCA-related cancer risk assessment.** / For women who have family members with a BRCA-related cancer (breast, ovarian, tubal, or peritoneal cancers).  Mammogram.** / Every year beginning at age 40 years and continuing for as long as you are in good health. Consult with your health care provider.  Pap test.** / Every 3 years starting at age 30 years through age 65 or 70 years with a history of 3 consecutive normal Pap tests.  HPV screening.** / Every 3 years from ages 30 years through ages 65 to 70 years with a history of 3 consecutive normal Pap tests.  Fecal occult blood test (FOBT) of stool. / Every year beginning at age 50 years and continuing until age 75 years. You may not need to do this test if you get a colonoscopy every 10 years.  Flexible sigmoidoscopy or colonoscopy.** / Every 5 years for a flexible sigmoidoscopy or every 10 years for a colonoscopy beginning at age 50 years and continuing until age 75 years.  Hepatitis C blood test.** / For all people born from 1945 through  1965 and any individual with known risks for hepatitis C.  Skin self-exam. / Monthly.  Influenza vaccine. / Every year.  Tetanus, diphtheria, and acellular pertussis (Tdap/Td) vaccine.** / Consult your health care provider. Pregnant women should receive 1 dose of Tdap vaccine during each pregnancy. 1 dose of Td every 10 years.  Varicella vaccine.** / Consult your health care provider. Pregnant females who do not have evidence of immunity should receive the first dose after pregnancy.  Zoster vaccine.** / 1 dose for adults aged 60 years or older.  Measles, mumps, rubella (MMR) vaccine.** / You need at least 1 dose of MMR if you were born in 1957 or later. You may also need a 2nd dose. For females of childbearing age, rubella immunity should be determined. If there is no evidence of immunity, females who are not pregnant should be vaccinated. If there is no evidence of immunity, females who are pregnant should delay immunization until after pregnancy.  Pneumococcal 13-valent conjugate (PCV13) vaccine.** / Consult your health care provider.  Pneumococcal polysaccharide (PPSV23) vaccine.** / 1 to 2 doses if you smoke cigarettes or if you have certain conditions.  Meningococcal vaccine.** / Consult your health care provider.  Hepatitis A vaccine.** / Consult your health care provider.  Hepatitis B vaccine.** / Consult your health care provider.  Haemophilus influenzae type b (Hib) vaccine.** / Consult your health care provider. Ages 65   years and over  Blood pressure check.** / Every 1 to 2 years.  Lipid and cholesterol check.** / Every 5 years beginning at age 22 years.  Lung cancer screening. / Every year if you are aged 73-80 years and have a 30-pack-year history of smoking and currently smoke or have quit within the past 15 years. Yearly screening is stopped once you have quit smoking for at least 15 years or develop a health problem that would prevent you from having lung cancer  treatment.  Clinical breast exam.** / Every year after age 4 years.  BRCA-related cancer risk assessment.** / For women who have family members with a BRCA-related cancer (breast, ovarian, tubal, or peritoneal cancers).  Mammogram.** / Every year beginning at age 40 years and continuing for as long as you are in good health. Consult with your health care provider.  Pap test.** / Every 3 years starting at age 9 years through age 34 or 91 years with 3 consecutive normal Pap tests. Testing can be stopped between 65 and 70 years with 3 consecutive normal Pap tests and no abnormal Pap or HPV tests in the past 10 years.  HPV screening.** / Every 3 years from ages 57 years through ages 64 or 45 years with a history of 3 consecutive normal Pap tests. Testing can be stopped between 65 and 70 years with 3 consecutive normal Pap tests and no abnormal Pap or HPV tests in the past 10 years.  Fecal occult blood test (FOBT) of stool. / Every year beginning at age 15 years and continuing until age 17 years. You may not need to do this test if you get a colonoscopy every 10 years.  Flexible sigmoidoscopy or colonoscopy.** / Every 5 years for a flexible sigmoidoscopy or every 10 years for a colonoscopy beginning at age 86 years and continuing until age 71 years.  Hepatitis C blood test.** / For all people born from [birth year] through 1966 and any individual with known risks for hepatitis C.  Osteoporosis screening.** / A one-time screening for women ages 83 years and over and women at risk for fractures or osteoporosis.  Skin self-exam. / Monthly.  Influenza vaccine. / Every year.  Tetanus, diphtheria, and acellular pertussis (Tdap/Td) vaccine.** / 1 dose of Td every 10 years.  Varicella vaccine.** / Consult your health care provider.  Zoster vaccine.** / 1 dose for adults aged 61 years or older.  Pneumococcal 13-valent conjugate (PCV13) vaccine.** / Consult your health care provider.  Pneumococcal  polysaccharide (PPSV23) vaccine.** / 1 dose for all adults aged 28 years and older.  Meningococcal vaccine.** / Consult your health care provider.  Hepatitis A vaccine.** / Consult your health care provider.  Hepatitis B vaccine.** / Consult your health care provider.  Haemophilus influenzae type b (Hib) vaccine.** / Consult your health care provider. ** Family history and personal history of risk and conditions may change your health care provider's recommendations. Document Released: 11/17/2001 Document Revised: 02/05/2014 Document Reviewed: 02/16/2011 [facility] Patient Information 2015 [place], [place]. This information is not intended to replace advice given to you by your health care provider. Make sure you discuss any questions you have with your health care provider.

## 2015-05-28 ENCOUNTER — Other Ambulatory Visit: Payer: Self-pay | Admitting: Osteopathic Medicine

## 2015-05-28 DIAGNOSIS — N926 Irregular menstruation, unspecified: Secondary | ICD-10-CM

## 2015-05-29 ENCOUNTER — Ambulatory Visit (INDEPENDENT_AMBULATORY_CARE_PROVIDER_SITE_OTHER): Payer: BC Managed Care – PPO

## 2015-05-29 DIAGNOSIS — N926 Irregular menstruation, unspecified: Secondary | ICD-10-CM | POA: Diagnosis not present

## 2015-05-29 LAB — DHEA-SULFATE: DHEA-SO4: 142 ug/dL (ref 23–266)

## 2015-05-29 LAB — TESTOSTERONE, FREE, TOTAL, SHBG
Sex Hormone Binding: 82 nmol/L (ref 17–124)
TESTOSTERONE-% FREE: 1 % (ref 0.4–2.4)
Testosterone, Free: 6.9 pg/mL — ABNORMAL HIGH (ref 0.6–6.8)
Testosterone: 71 ng/dL — ABNORMAL HIGH (ref 10–70)

## 2015-05-29 LAB — FSH/LH
FSH: 7.8 m[IU]/mL
LH: 5.1 m[IU]/mL

## 2015-05-29 LAB — PREGNANCY, URINE: Preg Test, Ur: NEGATIVE

## 2015-05-29 LAB — PROLACTIN: Prolactin: 7.4 ng/mL

## 2015-05-30 LAB — 17-HYDROXYPROGESTERONE: 17-OH-PROGESTERONE, LC/MS/MS: 26 ng/dL

## 2015-06-07 ENCOUNTER — Ambulatory Visit (INDEPENDENT_AMBULATORY_CARE_PROVIDER_SITE_OTHER): Payer: BC Managed Care – PPO | Admitting: Osteopathic Medicine

## 2015-06-07 ENCOUNTER — Encounter: Payer: Self-pay | Admitting: Osteopathic Medicine

## 2015-06-07 VITALS — BP 100/76 | HR 83 | Temp 98.3°F | Wt 187.0 lb

## 2015-06-07 DIAGNOSIS — N926 Irregular menstruation, unspecified: Secondary | ICD-10-CM

## 2015-06-07 DIAGNOSIS — L708 Other acne: Secondary | ICD-10-CM | POA: Diagnosis not present

## 2015-06-07 DIAGNOSIS — J069 Acute upper respiratory infection, unspecified: Secondary | ICD-10-CM

## 2015-06-07 DIAGNOSIS — R635 Abnormal weight gain: Secondary | ICD-10-CM

## 2015-06-07 DIAGNOSIS — R5383 Other fatigue: Secondary | ICD-10-CM | POA: Diagnosis not present

## 2015-06-07 MED ORDER — ETHYNODIOL DIAC-ETH ESTRADIOL 1-50 MG-MCG PO TABS: 1.0000 | ORAL_TABLET | Freq: Every day | ORAL | Status: DC

## 2015-06-07 NOTE — Progress Notes (Signed)
HPI:  Kathleen Ortega is a 39 y.o. female who presents to [facility] [place]  today for  RECHECK: weight gain 34 lbs over past year despite reportedly healthy diet and exercise habits.  NEW COMPLAINT: Head congestion and cough for 3 days.   LMP 8/28-30/16. C/o hair growth on breast/chest/neck, acne, fatigue ongoing for about 6 mos. Mirena removed 02/2015 - symptoms seem to have gotten worse since Mirena out ut no worse since last visit. Periods seem erratic since Mirena out but usually between 22-31 days but has only had 3 periods, heavy, last 2 - 3 days. She had no periods on Mirena. Husband has vasectomy. Helped by: nothing. No FH early menopause. Labs and imaging results reviewed as below. Benzoyl peroxide is helping acne on face    Past medical, social and family history reviewed: Past Medical History  Diagnosis Date  . IBS (irritable bowel syndrome)   . Seasonal allergies    Past Surgical History  Procedure Laterality Date  . Lasik    . Cholecystectomy     Social History  Substance Use Topics  . Smoking status: Never Smoker   . Smokeless tobacco: Not on file  . Alcohol Use: Yes   Family History  Problem Relation Age of Onset  . Hypertension Mother   . Hyperlipidemia Father   . Alcohol abuse Brother   . Diabetes Maternal Grandmother   . Stroke Maternal Grandfather   . Cancer Paternal Grandmother     breast    Current Outpatient Prescriptions  Medication Sig Dispense Refill  . ALPRAZolam (XANAX) 0.5 MG tablet Take 0.5 mg by mouth as needed for anxiety.    . Benzoyl Peroxide 5 % lotion APPLY TOPICALLY TO AFFECTED AREA EVERY NIGHT, WASH OFF IN MORNING. NOTE - MAY BLEACH CLOTHING/LINENS 30 mL 0  . minocycline (DYNACIN) 50 MG tablet Take 50 mg by mouth daily.     No current facility-administered medications for this visit.   Allergies  Allergen Reactions  . Codeine Hives and Swelling  . Erythromycin Hives and Swelling  . Penicillins Hives  and Swelling  . Sulfa Antibiotics Hives and Swelling  . Sulfamethoxazole-Trimethoprim Hives and Swelling     Review of Systems: CONSTITUTIONAL: Neg fever/chills, (+) unintentional weight gain HEAD/EYES/EARS/NOSE: No headache/vision change or hearing change positive sinus congestion 2 days,  CARDIAC: No chest pain/pressure/palpitations, no orthopnea RESPIRATORY: No cough/shortness of breath/wheeze GASTROINTESTINAL: No nausea/vomiting/abdominal pain/blood in stool/diarrhea/constipation MUSCULOSKELETAL: No myalgia/arthralgia GENITOURINARY: No incontinence, No abnormal genital bleeding/discharge, thinks periods are too close together SKIN: No rash/wounds/concerning lesions. (+) acne and hair grown as per HPI HEM/ONC: No easy bruising/bleeding, no abnormal lymph node PSYCHIATRIC: No concerns with depression/anxiety or sleep problems  ENDOCRINE: No heat/cold intolerance, no DM or Thyroid problems, (+) night sweats occasionally   Exam:  LMP 05/24/2015 Constitutional: VSS, see above. General Appearance: alert, well-developed, well-nourished, NAD Eyes: Normal lids and conjunctive, non-icteric sclera, PERRLA Ears, Nose, Mouth, Throat: Normal external inspection ears/nares/mouth/lips/gums, MMM, posterior pharynx without erythema/exudate Neck: No masses, trachea midline. No thyroid enlargement/tenderness/mass appreciated Respiratory: Normal respiratory effort. No dullness/hyper-resonance to percussion. Breath sounds normal, no wheeze/rhonchi/rales Cardiovascular: S1/S2 normal, no murmur/rub/gallop auscultated. No carotid bruit or JVD. No abdominal aortic bruit. Pedal pulse II/IV bilaterally DP and PT. No lower extremity edema. Skin: warm, dry, no rashes/ulcers, (+) acne vulgaris on face improved from previous exam, chest, shoulders - mild/moderate. No nodules/induration. No striae or abnormal fat deposits.  Psychiatric: Normal judgment/insight. Normal mood and affect. Oriented x3.  Lab results  reviewed from [facility] 05/09/15: endocrine labs were done in afternoon but all in normal range. CMP, CBC, Lipid, TSH, Insulin, Iron/anemia profile, adrenal labs, all WNL. Vit D mild lower.   Labs done at last visit 05/24/15 reviewed: Testosterone mildly elevated (71 while 70 is upper limit normal), otherwise normal.   ASSESSMENT/PLAN:  Irregular menstrual cycle - Plan: ethynodiol-ethinyl estradiol (ZOVIA) 1-50 MG-MCG tablet  Weight gain  Other fatigue  Other acne  Viral URI - supportive care and symptomatically treatment discussed, if no better in the next 5-7 days return to clinic, no antibiotic treatment necessary this time  Prescribed oral contraceptive pill with relatively higher estrogen concentration to help symptoms. Patient will trial this for 2-3 months and see if there is any improvement in symptoms. If her problems persist may consider repeating endocrine labs, patient was given the option to repeat testosterone levels to confirm but will hold off at this time.  Weight gain - Plan: labs normal. Diet/exercise counseling provided.Return to clinic to discuss /consider anti-obesity medication   Other acne - Plan: continue Benzoyl Peroxide 5 % lotion, can continue Doxy as well  Note: Consider PCOS - [place] Criteria (2 of the following) Oligo/anovulation in absence of other cause - difficult to determine given she has only had 2 periods since removal of Mirena.  Hypoerandrogenism (clinical or biochemical) - (+) w/ acne, hirsutism but (-)/borderline  labs  Polycystic ovaries by [place] - normal ovaries   Note: low suspicion for Cushing's or other adrenal disorder given symptoms

## 2015-06-07 NOTE — Patient Instructions (Signed)
Try pills for 2 - 3 months and we will recheck your symptoms at that time. Return to clinic sooner if any concerns.

## 2015-09-09 ENCOUNTER — Encounter: Payer: Self-pay | Admitting: Osteopathic Medicine

## 2015-09-09 ENCOUNTER — Ambulatory Visit (INDEPENDENT_AMBULATORY_CARE_PROVIDER_SITE_OTHER): Payer: BC Managed Care – PPO | Admitting: Osteopathic Medicine

## 2015-09-09 VITALS — BP 125/75 | HR 74 | Ht 66.0 in | Wt 192.0 lb

## 2015-09-09 DIAGNOSIS — R635 Abnormal weight gain: Secondary | ICD-10-CM

## 2015-09-09 DIAGNOSIS — N926 Irregular menstruation, unspecified: Secondary | ICD-10-CM | POA: Diagnosis not present

## 2015-09-09 DIAGNOSIS — R5383 Other fatigue: Secondary | ICD-10-CM | POA: Diagnosis not present

## 2015-09-09 DIAGNOSIS — L680 Hirsutism: Secondary | ICD-10-CM

## 2015-09-09 DIAGNOSIS — L708 Other acne: Secondary | ICD-10-CM | POA: Diagnosis not present

## 2015-09-09 MED ORDER — PHENTERMINE HCL 15 MG PO CAPS: 15.0000 mg | ORAL_CAPSULE | ORAL | Status: DC

## 2015-09-09 NOTE — Progress Notes (Signed)
HPI: Kathleen Ortega is a 39 y.o. female who [facility] [place]  today for chief complaint of:  Chief Complaint  Patient presents with  . Follow-up    PERIODS - STABLE - taking OCP, was having some spotting on BC but now using continuous (skipping placebo) and no issues so far.   WEIGHT -  WORSE - patient following with personal trainer, calorie counting, demonstrating any progress in terms of weight loss. Has gained a few pounds since last visit about 3 months ago.  MEDICATIONS - Xanax is on her list, I have not refilled this for her, she states that uses 1 or 2 pills a few times a month, no significant anxiety issues. She stopped taking the doxycycline for acne, is using the benzoyl peroxide as needed.   Past medical, social and family history reviewed: Past Medical History  Diagnosis Date  . IBS (irritable bowel syndrome)   . Seasonal allergies    Past Surgical History  Procedure Laterality Date  . Lasik    . Cholecystectomy     Social History  Substance Use Topics  . Smoking status: Never Smoker   . Smokeless tobacco: Not on file  . Alcohol Use: Yes   Family History  Problem Relation Age of Onset  . Hypertension Mother   . Hyperlipidemia Father   . Alcohol abuse Brother   . Diabetes Maternal Grandmother   . Stroke Maternal Grandfather   . Cancer Paternal Grandmother     breast    Current Outpatient Prescriptions  Medication Sig Dispense Refill  . ALPRAZolam (XANAX) 0.5 MG tablet Take 0.5 mg by mouth as needed for anxiety.    . Benzoyl Peroxide 5 % lotion APPLY TOPICALLY TO AFFECTED AREA EVERY NIGHT, WASH OFF IN MORNING. NOTE - MAY BLEACH CLOTHING/LINENS 30 mL 0  . ethynodiol-ethinyl estradiol (ZOVIA) 1-50 MG-MCG tablet Take 1 tablet by mouth daily. 1 Package 11  . minocycline (DYNACIN) 50 MG tablet Take 50 mg by mouth daily.     No current facility-administered medications for this visit.   Allergies  Allergen Reactions   . Codeine Hives and Swelling  . Erythromycin Hives and Swelling  . Penicillins Hives and Swelling  . Sulfa Antibiotics Hives and Swelling  . Sulfamethoxazole-Trimethoprim Hives and Swelling      Review of Systems: CONSTITUTIONAL:  No  fever, no chills, (+) unintentional weight changes HEAD/EYES/EARS/NOSE/THROAT: No headache, no vision change,  CARDIAC: No chest pain, no pressure/palpitations, no orthopnea RESPIRATORY: No  cough, No  shortness of breath/wheeze GASTROINTESTINAL: No nausea, no vomiting, no abdominal pain, PSYCHIATRIC: No concerns with depression, no concerns with anxiety, no sleep problems    Exam:  BP 125/75 mmHg  Pulse 74  Ht 5\' 6"  (1.676 m)  Wt 192 lb (87.091 kg)  BMI 31.00 kg/m2 Constitutional: VSS, see above. General Appearance: alert, well-developed, well-nourished, NAD Respiratory: Normal respiratory effort. no wheeze, no rhonchi, no rales Cardiovascular: S1/S2 normal, no murmur, no rub/gallop auscultated. RRR.  Psychiatric: Normal judgment/insight. Normal mood and affect. Oriented x3.    No results found for this or any previous visit (from the past 72 hour(s)).    ASSESSMENT/PLAN: Try phentermine for weight last, patient advised of side effects, return to clinic for visit with me to do blood pressure check and weight check, if no issues can consider increasing dose and initiating follow-up with nurse visit. There is some consideration for diagnosis of PCOS, patient is overall improved on oral contraceptive pills, may  consider repeat labs, PCO S may be reason why she is having difficulty with weight, will monitor on phentermine.   Weight gain - Plan: phentermine 15 MG capsule  Irregular menstrual cycle  Other acne  Other fatigue  Hirsutism     Return in about 4 weeks (around 10/07/2015) for medication monitoring.

## 2015-10-10 ENCOUNTER — Encounter: Payer: Self-pay | Admitting: Osteopathic Medicine

## 2015-10-10 ENCOUNTER — Ambulatory Visit (INDEPENDENT_AMBULATORY_CARE_PROVIDER_SITE_OTHER): Payer: BC Managed Care – PPO | Admitting: Osteopathic Medicine

## 2015-10-10 VITALS — BP 108/71 | HR 80 | Wt 188.0 lb

## 2015-10-10 DIAGNOSIS — R635 Abnormal weight gain: Secondary | ICD-10-CM | POA: Diagnosis not present

## 2015-10-10 DIAGNOSIS — N926 Irregular menstruation, unspecified: Secondary | ICD-10-CM

## 2015-10-10 DIAGNOSIS — Z79899 Other long term (current) drug therapy: Secondary | ICD-10-CM | POA: Insufficient documentation

## 2015-10-10 HISTORY — DX: Irregular menstruation, unspecified: N92.6

## 2015-10-10 MED ORDER — PHENTERMINE HCL 37.5 MG PO CAPS: 37.5000 mg | ORAL_CAPSULE | ORAL | Status: DC

## 2015-10-10 NOTE — Progress Notes (Signed)
HPI: Kathleen Ortega is a 40 y.o. female who presents to [facility] [place] [place]  today for chief complaint of:  Chief Complaint  Patient presents with  . Weight Check   PERIODS - worse - taking OCP, was having some spotting on BC but now using continuous (skipping placebo) and did have some breakthrough bleeding on and off for about 2 weeks.   WEIGHT - stable/improved - patient following with personal trainer, calorie counting, demonstrating little progress in terms of weight loss before being started on Phentermine last visit and here today for weight and BP check and consider increasing dose. Doing well on the medicine, no increase in anxiety or jitteriness, see VS below.     Past medical, social and family history reviewed: Past Medical History  Diagnosis Date  . IBS (irritable bowel syndrome)   . Seasonal allergies   . Irregular periods/menstrual cycles 10/10/2015   Past Surgical History  Procedure Laterality Date  . Lasik    . Cholecystectomy     Social History  Substance Use Topics  . Smoking status: Never Smoker   . Smokeless tobacco: Not on file  . Alcohol Use: Yes   Family History  Problem Relation Age of Onset  . Hypertension Mother   . Hyperlipidemia Father   . Alcohol abuse Brother   . Diabetes Maternal Grandmother   . Stroke Maternal Grandfather   . Cancer Paternal Grandmother     breast    Current Outpatient Prescriptions  Medication Sig Dispense Refill  . ALPRAZolam (XANAX) 0.5 MG tablet Take 0.5 mg by mouth as needed for anxiety.    . Benzoyl Peroxide 5 % lotion APPLY TOPICALLY TO AFFECTED AREA EVERY NIGHT, WASH OFF IN MORNING. NOTE - MAY BLEACH CLOTHING/LINENS 30 mL 0  . ethynodiol-ethinyl estradiol (ZOVIA) 1-50 MG-MCG tablet Take 1 tablet by mouth daily. 1 Package 11  . phentermine 15 MG capsule Take 1 capsule (15 mg total) by mouth every morning. 30 capsule 0   No current facility-administered medications for this visit.    Allergies  Allergen Reactions  . Codeine Hives and Swelling  . Erythromycin Hives and Swelling  . Penicillins Hives and Swelling  . Sulfa Antibiotics Hives and Swelling  . Sulfamethoxazole-Trimethoprim Hives and Swelling      Review of Systems: CONSTITUTIONAL:  No  fever, no chills, (+) unintentional weight changes - gain CARDIAC: No chest pain, no pressure/palpitations, no orthopnea GASTROINTESTINAL: No nausea, no vomiting, no abdominal pain,  GYN: (+) irregular vaginal bleeding as per HPI, no unusual discharge, no pelvic pain PSYCHIATRIC: No concerns with depression, no concerns with anxiety, no sleep problems    Exam:  BP 108/71 mmHg  Pulse 80  Wt 188 lb (85.276 kg)  SpO2 100%  Was 192 lb last visit Constitutional: VSS, see above. General Appearance: alert, well-developed, well-nourished, NAD Respiratory: Normal respiratory effort. no wheeze, no rhonchi, no rales Cardiovascular: S1/S2 normal, no murmur, no rub/gallop auscultated. RRR.  Psychiatric: Normal judgment/insight. Normal mood and affect. Oriented x3.      ASSESSMENT/PLAN: Some success with weight loss on low-dose phentermine, no adverse effects, increased dose today and will f/u w/ nurse visit BP/weight check 4 weeks. Advised can stop continuous use OCP and try not skipping placebo week, if bleeding problems persist may consider exam/US. There is some consideration for diagnosis of PCOS, patient is overall improved on oral contraceptive pills, may consider repeat labs, PCO S may be reason why she is having difficulty with weight  Medication management  Irregular periods/menstrual cycles  Weight gain - Plan: phentermine 37.5 MG capsule     Return in about 4 weeks (around 11/07/2015) for nurse visit - weight and blood pressure check .

## 2015-11-07 ENCOUNTER — Ambulatory Visit (INDEPENDENT_AMBULATORY_CARE_PROVIDER_SITE_OTHER): Payer: BC Managed Care – PPO | Admitting: Osteopathic Medicine

## 2015-11-07 VITALS — BP 131/71 | HR 92 | Wt 183.0 lb

## 2015-11-07 DIAGNOSIS — R635 Abnormal weight gain: Secondary | ICD-10-CM | POA: Diagnosis not present

## 2015-11-07 MED ORDER — PHENTERMINE HCL 37.5 MG PO CAPS: 37.5000 mg | ORAL_CAPSULE | ORAL | Status: DC

## 2015-11-07 NOTE — Progress Notes (Signed)
   Subjective:    Patient ID: Kathleen Ortega, female    DOB: April 22, 1976, 40 y.o.   MRN: [id number]  HPI  UMI MAINOR is here for blood pressure and weight check. Diet and exercise is going well. Denies trouble sleeping or palpitations.    Review of Systems     Objective:   Physical Exam        Assessment & Plan:  Abnormal Weight Gain - Patient has lost weight. A refill will be faxed to [facility].

## 2015-11-07 NOTE — Progress Notes (Signed)
Filed Weights   11/07/15 0918  Weight: 183 lb (83.008 kg)   188 on 10/10/15 192 on 09/09/15 5% weight loss on phentermine x 2 mos  Plan to continue 3 - 4 months total and then reassess progress with visit w/ Dr Mervyn Skeeters

## 2015-11-08 ENCOUNTER — Other Ambulatory Visit: Payer: Self-pay | Admitting: Osteopathic Medicine

## 2015-12-05 ENCOUNTER — Ambulatory Visit (INDEPENDENT_AMBULATORY_CARE_PROVIDER_SITE_OTHER): Payer: BC Managed Care – PPO | Admitting: Osteopathic Medicine

## 2015-12-05 VITALS — BP 120/62 | HR 74 | Wt 179.0 lb

## 2015-12-05 DIAGNOSIS — R635 Abnormal weight gain: Secondary | ICD-10-CM

## 2015-12-05 MED ORDER — PHENTERMINE HCL 37.5 MG PO CAPS: 37.5000 mg | ORAL_CAPSULE | ORAL | Status: DC

## 2015-12-05 NOTE — Progress Notes (Signed)
Patient is here for blood pressure and weight check. Denies any trouble sleeping, palpitations, or any other medication problems. Patient has lost weight. A refill for Phentermine will be sent to patient preferred pharmacy. Patient advised to schedule a four week nurse visit and keep her upcoming appointment with her PCP. Verbalized understanding, no further questions. 

## 2015-12-05 NOTE — Progress Notes (Signed)
Rx signed, nurse note reviewed, nothing to add at this time.

## 2015-12-06 ENCOUNTER — Telehealth: Payer: Self-pay | Admitting: *Deleted

## 2015-12-06 NOTE — Telephone Encounter (Signed)
PA initiated for Phentermine through covermymeds. Key# [id number]

## 2015-12-20 NOTE — Telephone Encounter (Signed)
Pt and pharm notified.  

## 2015-12-20 NOTE — Telephone Encounter (Signed)
Your request has been approved  CaseId:[id number];Product Name:Weight loss drugs - benzphetamine (Didrex, Regimex), diethylpropion, phendimetrazine (Bontril PDM, Bontril), phentermine, (Adipex-P, Lomaira, Suprenza) - PA ESI;Status:Approved;Coverage Start Date:11/20/2015;Coverage End Date:12/19/2016;

## 2016-01-03 ENCOUNTER — Encounter: Payer: Self-pay | Admitting: Osteopathic Medicine

## 2016-01-03 ENCOUNTER — Ambulatory Visit (INDEPENDENT_AMBULATORY_CARE_PROVIDER_SITE_OTHER): Payer: BC Managed Care – PPO | Admitting: Osteopathic Medicine

## 2016-01-03 VITALS — BP 104/73 | HR 93 | Wt 177.0 lb

## 2016-01-03 DIAGNOSIS — R635 Abnormal weight gain: Secondary | ICD-10-CM | POA: Diagnosis not present

## 2016-01-03 DIAGNOSIS — E669 Obesity, unspecified: Secondary | ICD-10-CM

## 2016-01-03 MED ORDER — PHENTERMINE HCL 37.5 MG PO CAPS: 37.5000 mg | ORAL_CAPSULE | ORAL | Status: DC

## 2016-01-03 NOTE — Progress Notes (Signed)
BP 104/73 mmHg  Pulse 93  Wt 177 lb (80.287 kg) Weight: 177 01/03/2016 down 2 lbs from 179 12/05/15 BP ok Max weight 192 09/09/15 = 15 lbs On Phentermine since 09/09/15 = 4 months Refill today, plan for next visit to be OV with me to discuss alternative long-term meds and check on progress with lifestyle changes. Has appt 01/31/16  Nurse notes reviewed, nothing to add except needs OV but this is already scheduled.

## 2016-01-03 NOTE — Progress Notes (Signed)
Patient is here for blood pressure and weight check. Denies any trouble sleeping, palpitations, or any other medication problems. Patient has lost weight. A refill for Phentermine will be sent to patient preferred pharmacy. Patient advised to schedule a four week nurse visit and keep her upcoming appointment with her PCP. Verbalized understanding, no further questions. 

## 2016-01-31 ENCOUNTER — Ambulatory Visit (INDEPENDENT_AMBULATORY_CARE_PROVIDER_SITE_OTHER): Payer: BC Managed Care – PPO | Admitting: Osteopathic Medicine

## 2016-01-31 ENCOUNTER — Encounter: Payer: Self-pay | Admitting: Osteopathic Medicine

## 2016-01-31 VITALS — BP 114/80 | HR 96 | Wt 179.0 lb

## 2016-01-31 DIAGNOSIS — E282 Polycystic ovarian syndrome: Secondary | ICD-10-CM | POA: Diagnosis not present

## 2016-01-31 DIAGNOSIS — R635 Abnormal weight gain: Secondary | ICD-10-CM | POA: Diagnosis not present

## 2016-01-31 DIAGNOSIS — E669 Obesity, unspecified: Secondary | ICD-10-CM | POA: Diagnosis not present

## 2016-01-31 HISTORY — DX: Obesity, unspecified: E66.9

## 2016-01-31 MED ORDER — PHENTERMINE-TOPIRAMATE ER 3.75-23 MG PO CP24: 1.0000 | ORAL_CAPSULE | Freq: Every morning | ORAL | Status: DC

## 2016-01-31 MED ORDER — PHENTERMINE-TOPIRAMATE ER 7.5-46 MG PO CP24: 1.0000 | ORAL_CAPSULE | Freq: Every morning | ORAL | Status: DC

## 2016-01-31 MED ORDER — PHENTERMINE HCL 15 MG PO TBDP: 15.0000 mg | ORAL_TABLET | Freq: Every day | ORAL | Status: DC

## 2016-01-31 MED ORDER — NORGESTIMATE-ETH ESTRADIOL 0.25-35 MG-MCG PO TABS: 1.0000 | ORAL_TABLET | Freq: Every day | ORAL | Status: DC

## 2016-01-31 NOTE — Patient Instructions (Addendum)
PLAN FOR QSYMIA: Phentermine 3.75 mg/topiramate 23 mg once daily plus Phentermine 15 mg once daily (18.75 mg phentermine total) for 14 days,  Increase dose to phentermine 7.5 mg/topiramate 46 mg once daily for 12 weeks then evaluate weight loss. If 3% of baseline body weight has not been lost, discontinue use or  increase dose to phentermine 11.25 mg/topiramate 69 mg once daily for 14 days,  and then to phentermine 15 mg/topiramate 92 mg once daily. Evaluate weight loss after 12 weeks on phentermine 15 mg/topiramate 92 mg;  if 5% of baseline body weight has not been lost at dose of phentermine 15 mg/topiramate 92 mg gradually discontinue therapy   IF [employer] PROHIBITS FILLING THIS PRESCRIPTION, LET ME KNOW AND WE CAN DISCUSS ALTERNATIVES, OR ASK YOUR INSURANCE ABOUT THEIR FORMULARY COVERAGE.

## 2016-01-31 NOTE — Progress Notes (Signed)
HPI: Kathleen Ortega is a 40 y.o. female who presents to [facility] today for chief complaint of:  Chief Complaint  Patient presents with  . Follow-up    WEIGHT LOSS  . Vaginal Bleeding     Obesity - patient is on phentermine for about 5 months or so, she has had some weight loss but seems to have plateaued at this point though she does report that diet and exercise could be better, particularly due to increased stress over the past month or so. We discussed alternative medications that are approved for long-term use which may be helpful for her, see assessment/plan below  Vaginal spotting/medication management for PCOS - daily for past 6 weeks or so, on and off, light . Has been on this OCP about 6 months. Acne and facial hair have improved. She's been on Mirena in the past and did well with this however symptoms seemed to get a lot worse once the Mirena was removed, overall she's doing better on OCP except for the spotting      Past medical, social and family history reviewed: Past Medical History  Diagnosis Date  . IBS (irritable bowel syndrome)   . Seasonal allergies   . Irregular periods/menstrual cycles 10/10/2015  . Obesity 01/31/2016    Phentermine started 09/2015   Past Surgical History  Procedure Laterality Date  . Lasik    . Cholecystectomy     Social History  Substance Use Topics  . Smoking status: Never Smoker   . Smokeless tobacco: Not on file  . Alcohol Use: Yes   Family History  Problem Relation Age of Onset  . Hypertension Mother   . Hyperlipidemia Father   . Alcohol abuse Brother   . Diabetes Maternal Grandmother   . Stroke Maternal Grandfather   . Cancer Paternal Grandmother     breast    Current Outpatient Prescriptions  Medication Sig Dispense Refill  . ALPRAZolam (XANAX) 0.5 MG tablet Take 0.5 mg by mouth as needed for anxiety.    . Benzoyl Peroxide 5 % lotion APPLY TOPICALLY TO AFFECTED AREA EVERY NIGHT, WASH  OFF IN MORNING. NOTE - MAY BLEACH CLOTHING/LINENS 30 mL 0  . ethynodiol-ethinyl estradiol (ZOVIA) 1-50 MG-MCG tablet Take 1 tablet by mouth daily. 1 Package 11  . phentermine 37.5 MG capsule Take 1 capsule (37.5 mg total) by mouth every morning. 30 capsule 0   No current facility-administered medications for this visit.   Allergies  Allergen Reactions  . Codeine Hives and Swelling  . Erythromycin Hives and Swelling  . Penicillins Hives and Swelling  . Sulfa Antibiotics Hives and Swelling  . Sulfamethoxazole-Trimethoprim Hives and Swelling      Review of Systems: CONSTITUTIONAL:  No  fever, no chills, No  unintentional weight changes CARDIAC: No  chest pain GASTROINTESTINAL: No  nausea, No  vomiting, No  abdominal pain MUSCULOSKELETAL: No  myalgia/arthralgia GENITOURINARY: No  incontinence, (+) abnormal genital bleeding/discharge   Exam:  BP 114/80 mmHg  Pulse 96  Wt 179 lb (81.194 kg) Constitutional: VS see above. General Appearance: alert, well-developed, well-nourished, NAD Ears, Nose, Mouth, Throat: MMM  Neck: No masses, trachea midline Respiratory: Normal respiratory effort Psychiatric: Normal judgment/insight. Normal mood and affect.  No results found for this or any previous visit (from the past 72 hour(s)).  ASSESSMENT/PLAN: Patient has plateaued on phentermine, plan to taper down and transitioned to long-term treatment with Qsymia. See patient instructions for full details. Patient is advised that if she  is unable to afford the Qsymia she should contact her insurance company for their formulary and to see if they cover weight loss medications at all. Otherwise we can continue tapering down phentermine with refill of the 15 mg for another month while we explore alternative options. PCO S is also a consideration, could consider metformin. We switch birth control due to persistent spotting, pelvic exam if heavy bleeding or spotting persists. Husband has vasectomy  Obesity -  Plan: Phentermine-Topiramate 3.75-23 MG CP24, Phentermine-Topiramate 7.5-46 MG CP24, Phentermine HCl 15 MG TBDP  Weight gain  PCOS (polycystic ovarian syndrome) - Plan: norgestimate-ethinyl estradiol (ORTHO-CYCLEN,SPRINTEC,PREVIFEM) 0.25-35 MG-MCG tablet   All questions were answered. Visit summary with updated medication list and pertinent instructions was printed for patient. ER/RTC precautions were reviewed with the patient. Return in about 6 weeks (around 03/13/2016), or sooner if needed, for NURSE VISIT WEIGHT CHECK.

## 2016-02-05 ENCOUNTER — Telehealth: Payer: Self-pay | Admitting: *Deleted

## 2016-02-05 NOTE — Telephone Encounter (Signed)
Your request has been approved  ZOXWRU:[id number];XBJYNWGCaseId:[id number];Product Name:Weight loss drugs - Qsymia - [place] ESI;Status:Approved;Coverage Start Date:01/06/2016;Coverage End Date:08/03/2016;   Pt and pharm notified

## 2016-02-05 NOTE — Telephone Encounter (Signed)
Prior Auth initiated for qsymia through covermymeds

## 2016-03-13 ENCOUNTER — Ambulatory Visit: Payer: BC Managed Care – PPO

## 2016-03-22 ENCOUNTER — Other Ambulatory Visit: Payer: Self-pay | Admitting: Osteopathic Medicine

## 2016-03-23 ENCOUNTER — Ambulatory Visit (INDEPENDENT_AMBULATORY_CARE_PROVIDER_SITE_OTHER): Payer: BC Managed Care – PPO | Admitting: Osteopathic Medicine

## 2016-03-23 VITALS — BP 100/68 | HR 86 | Temp 98.0°F | Resp 16 | Wt 180.0 lb

## 2016-03-23 DIAGNOSIS — E669 Obesity, unspecified: Secondary | ICD-10-CM

## 2016-03-23 MED ORDER — PHENTERMINE-TOPIRAMATE ER 11.25-69 MG PO CP24: 1.0000 | ORAL_CAPSULE | Freq: Every morning | ORAL | Status: DC

## 2016-03-23 MED ORDER — PHENTERMINE-TOPIRAMATE ER 7.5-46 MG PO CP24: 1.0000 | ORAL_CAPSULE | Freq: Every morning | ORAL | Status: DC

## 2016-03-23 NOTE — Progress Notes (Signed)
Vitals with BMI 03/23/2016 01/31/2016 01/03/2016 12/05/2015 11/07/2015  Height       Weight 180 lbs 179 lbs 177 lbs 179 lbs 183 lbs  BMI       Systolic 100 114 161104 120 131  Diastolic 68 80 73 62 71  Pulse 86 96 93 74 92  Respirations 16       Vitals with BMI 10/10/2015 09/09/2015 06/07/2015 05/24/2015 08/21/2014  Height  5\' 6"   5\' 6"  5\' 7"   Weight 188 lbs 192 lbs 187 lbs 186 lbs 173 lbs  BMI  31.1  30.1 27.2  Systolic 108 125 096100 93 117  Diastolic 71 75 76 60 82  Pulse 80 74 83 68 92  Respirations     18   Vitals with BMI 08/12/2014 05/19/2012  Height 5' 6.5" 5\' 7"   Weight 165 lbs 162 lbs  BMI 26.3 25.4  Systolic 119 106  Diastolic 72 67  Pulse 100 66  Respirations 16 14     Ok to increase dose if patient would prefer - please call and see if she's like to go up on dose or stay the same and I'll fax Rx to phramacy based on her preference. Thanks.

## 2016-03-23 NOTE — Progress Notes (Signed)
Called and spoke with patient and she will finish her current dose prescribe (just started it on 6/05), then get the higher dose you just called in for her. PAK

## 2016-03-23 NOTE — Progress Notes (Signed)
   Subjective:    Patient ID: Kathleen Ortega, female    DOB: 1976/03/03, 40 y.o.   MRN: [id number]  HPIPatient is here for blood pressure and weight check. She denies trouble sleeping, palpitations or medication problems. She is currently on prednisone for an ear problem and feels this is contributing to lack of progress over last interval with weight loss.    Review of Systems     Objective:   Physical Exam        Assessment & Plan:  Patient has not lost weight, but wants to continue with refill since med was working prior to course of prednisone. A refill will be faxed to pharmacy. Patient advised to follow up with nurse in 30 days.

## 2016-04-16 ENCOUNTER — Telehealth: Payer: Self-pay

## 2016-04-16 NOTE — Telephone Encounter (Signed)
Kathleen Ortega called and states the increased dose of Qsymia needs a prior authorization.

## 2016-04-22 ENCOUNTER — Ambulatory Visit (INDEPENDENT_AMBULATORY_CARE_PROVIDER_SITE_OTHER): Payer: BC Managed Care – PPO | Admitting: Family Medicine

## 2016-04-22 VITALS — BP 104/69 | HR 71 | Wt 180.0 lb

## 2016-04-22 DIAGNOSIS — E669 Obesity, unspecified: Secondary | ICD-10-CM | POA: Diagnosis not present

## 2016-04-22 DIAGNOSIS — R635 Abnormal weight gain: Secondary | ICD-10-CM

## 2016-04-22 MED ORDER — PHENTERMINE-TOPIRAMATE ER 11.25-69 MG PO CP24: 1.0000 | ORAL_CAPSULE | Freq: Every morning | ORAL | Status: DC

## 2016-04-22 NOTE — Progress Notes (Signed)
Patient doing well on appetite suppressant.  Here for nurse visit, weight, BP, HR check.  Denies problems with insomnia, heart palpitations or tremors.  Satisfied with weight loss thus far and is working on [employer] diet and regular exercise.     Patient did not gain or lose weight.  NOTE PATIENT HAS BEEN OFF OF QSYMIA SINCE THE LAST VISIT 03/23/2016. Pt states the pharmacy did not send Prior authorization notification to our office until last Friday.

## 2016-04-22 NOTE — Telephone Encounter (Signed)
Previous Berkley Harveyauth is good through October  ..the patient recently prescribed different dose; PA   response back was  Information regarding your request  PA was already submitted for this patient and drug which was approved.;MVHQIO:[id number];XLKGMW:NUUVOZDGCaseId:[id number];Status:Approved;Coverage Start UYQI:[id number];Coverage End GLOV:[id number]  Called insurance and had them to correct in system. New dose has been updated in [facility] system and has been approved. Case ID remains the same.   Left message on pharm vm and pt vm

## 2016-04-22 NOTE — Progress Notes (Signed)
Weight loss success [place], Rx placed in in-box ready for pickup/faxing.

## 2016-04-23 NOTE — Progress Notes (Signed)
Rx faxed

## 2016-05-17 IMAGING — US US TRANSVAGINAL NON-OB
1 series · 14 of 25 positions shown · non-contrast
Comparison: None

CLINICAL DATA: Irregular menses.

EXAM:
TRANSABDOMINAL AND TRANSVAGINAL ULTRASOUND OF PELVIS
TECHNIQUE: Both transabdominal and transvaginal ultrasound examinations of the
pelvis were performed. Transabdominal technique was performed for
global imaging of the pelvis including uterus, ovaries, adnexal
regions, and pelvic cul-de-sac. It was necessary to proceed with
endovaginal exam following the transabdominal exam to visualize the
endometrium and ovaries.

[Series 1: us transvaginal non-ob · 0.20mm/px · 14 of 82 slices shown]
[im 1/82]
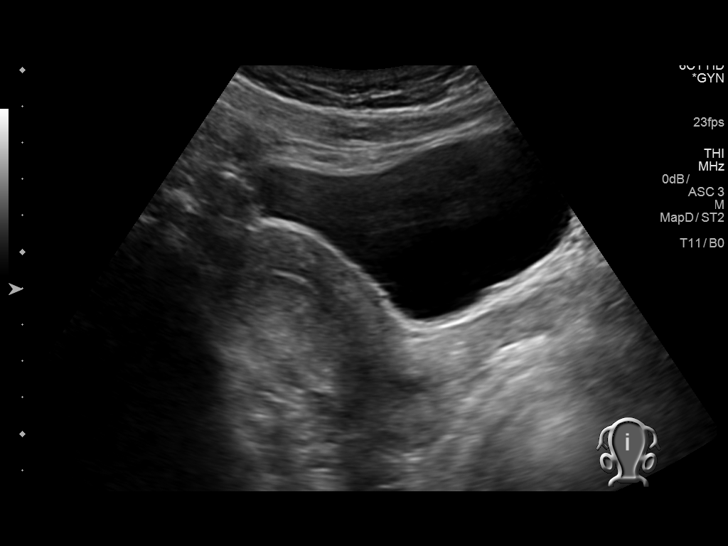
[im 7/82]
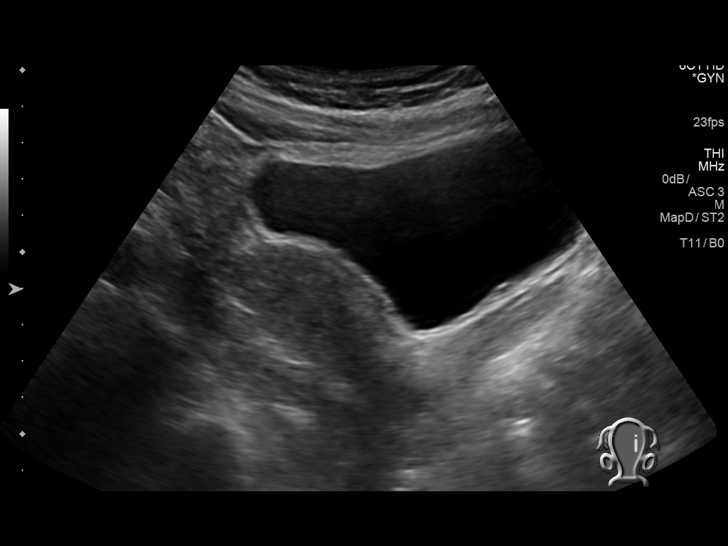
[im 14/82]
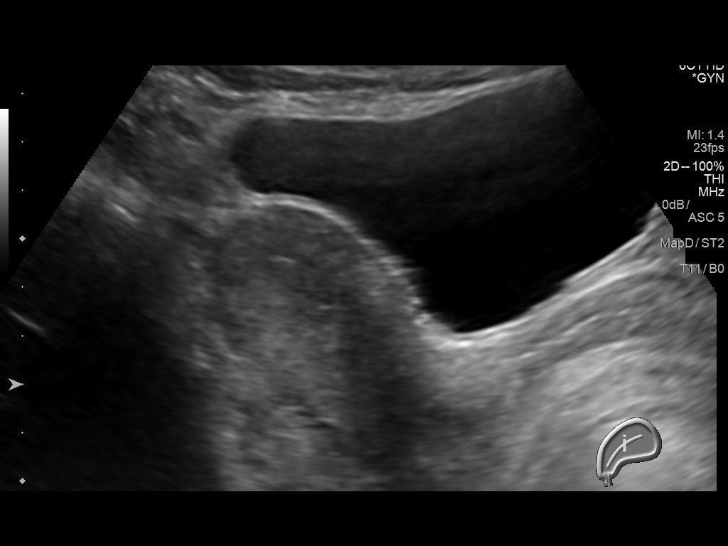
[im 21/82]
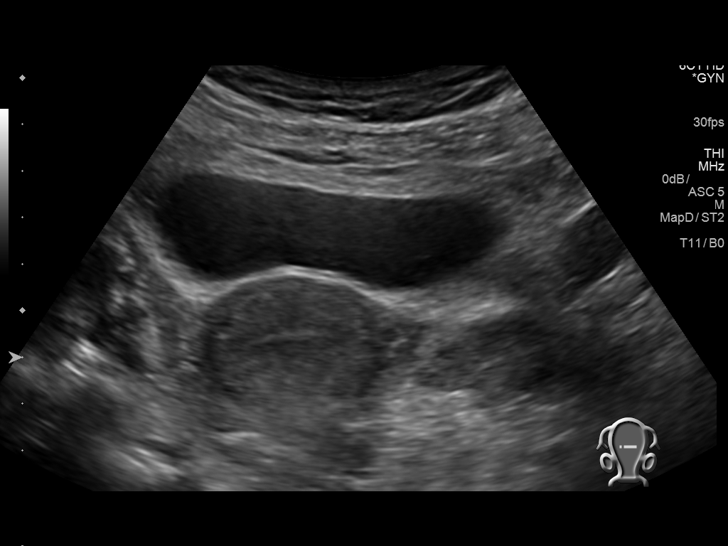
[im 28/82]
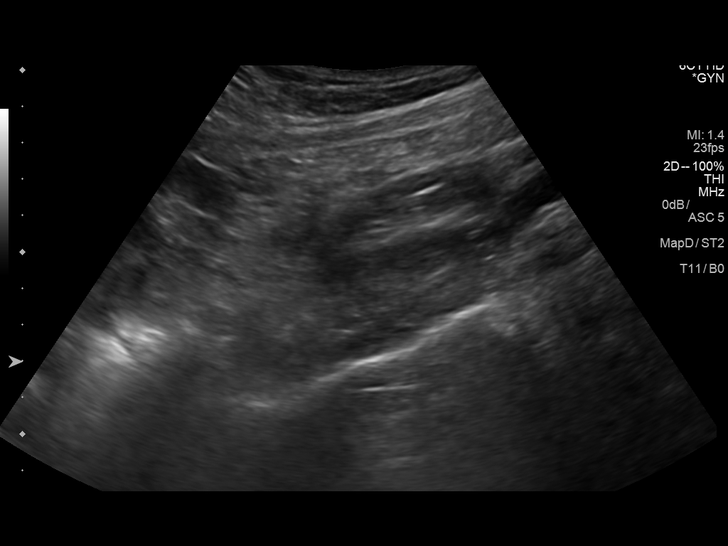
[im 31/82]
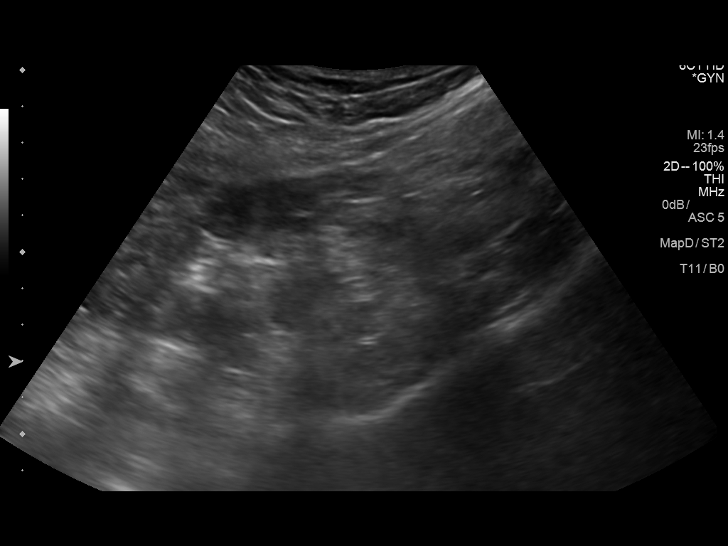
[im 38/82]
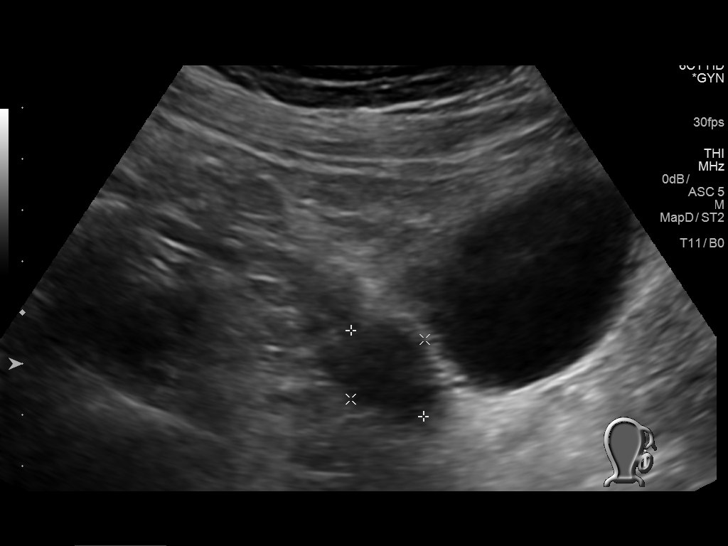
[im 44/82]
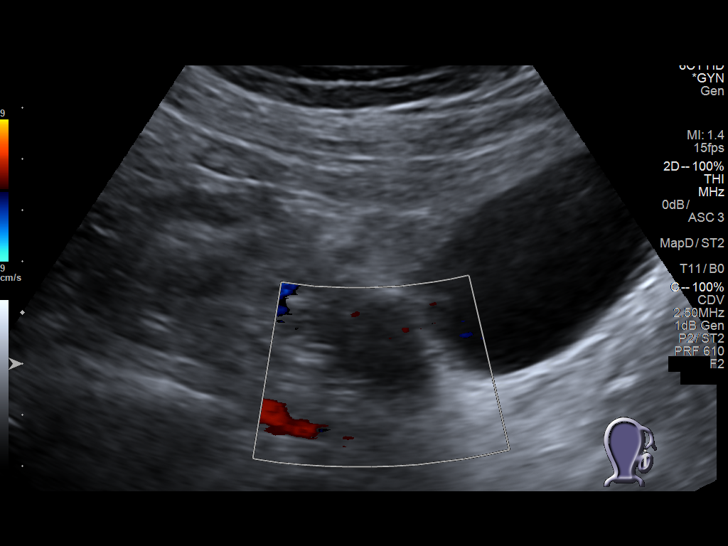
[im 51/82]
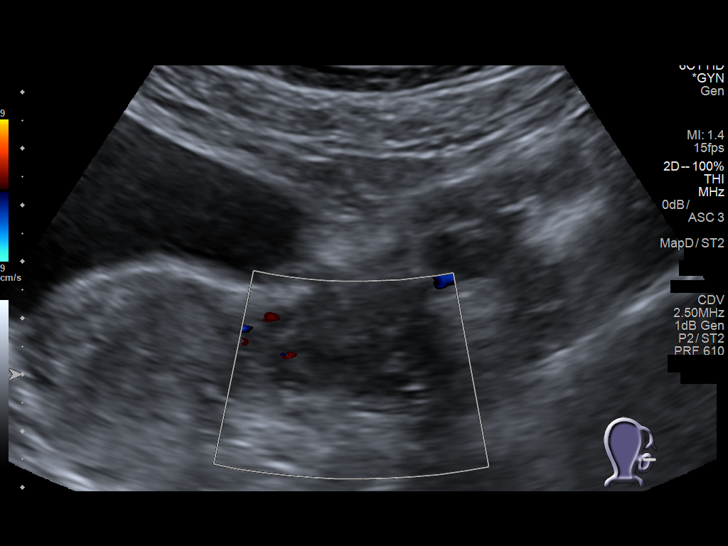
[im 55/82]
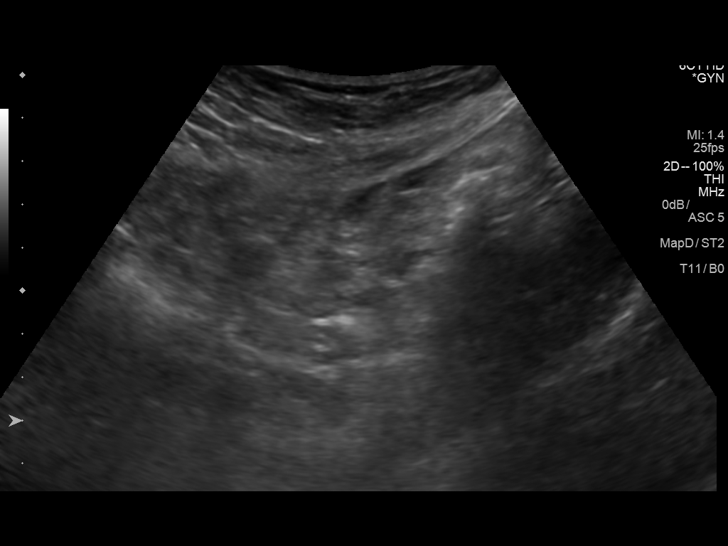
[im 61/82]
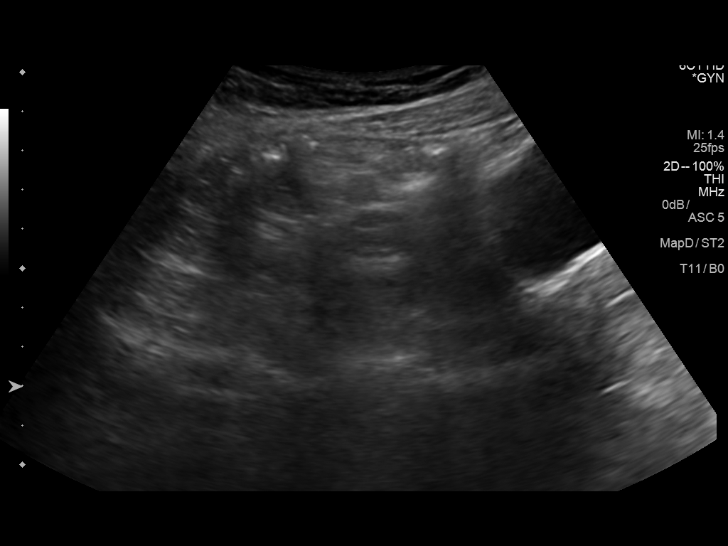
[im 68/82]
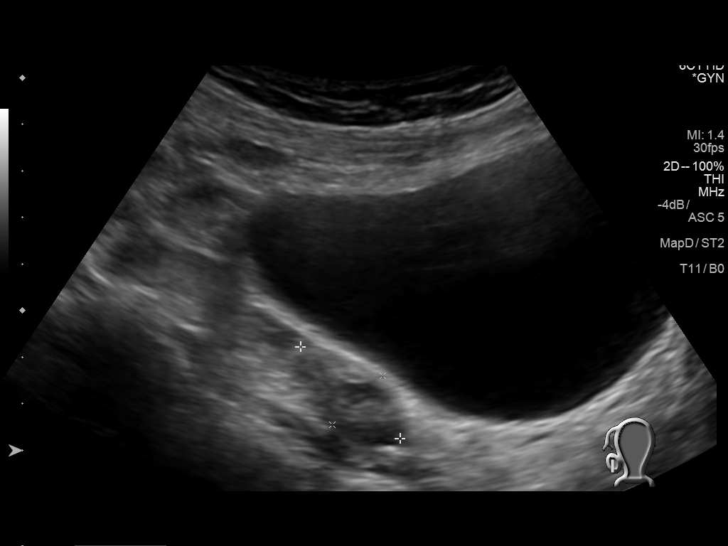
[im 75/82]
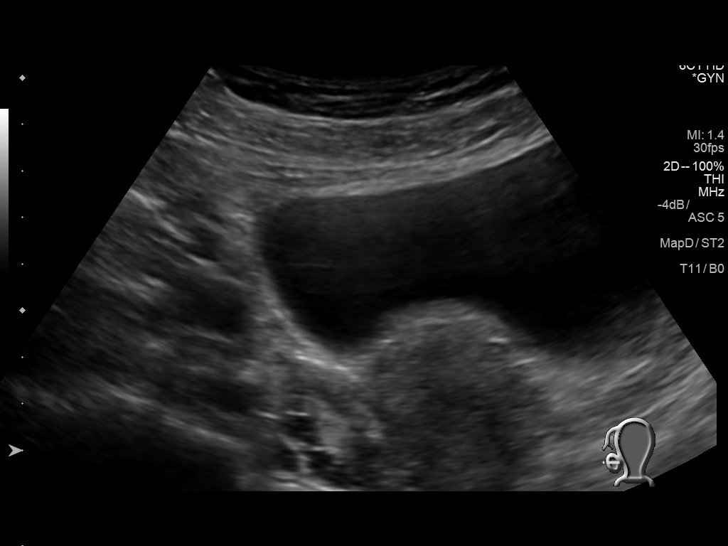
[im 82/82]
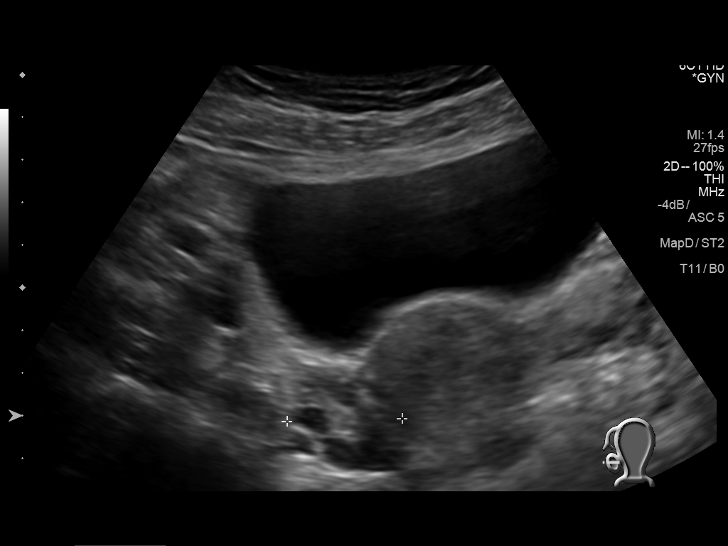

[14 of 25 positions shown; findings below may reference images not displayed]

FINDINGS: Uterus

Measurements: 7.3 x 4.1 x 3.2 cm. No fibroids or other mass
visualized.

Endometrium

Thickness: 4.4 mm which is within normal limits. No focal
abnormality visualized.

Right ovary

Measurements: 2.9 x 2.4 x 1.5 cm. Normal appearance/no adnexal mass.

Left ovary

Measurements: 2.9 x 2.3 x 1.7 cm. Normal appearance/no adnexal mass.

Other findings

No free fluid.
IMPRESSION: No definite abnormality seen in the pelvis.

## 2016-05-20 ENCOUNTER — Ambulatory Visit (INDEPENDENT_AMBULATORY_CARE_PROVIDER_SITE_OTHER): Payer: BC Managed Care – PPO | Admitting: Osteopathic Medicine

## 2016-05-20 ENCOUNTER — Encounter: Payer: Self-pay | Admitting: Osteopathic Medicine

## 2016-05-20 VITALS — BP 115/63 | HR 72 | Ht 66.0 in | Wt 178.0 lb

## 2016-05-20 DIAGNOSIS — J0100 Acute maxillary sinusitis, unspecified: Secondary | ICD-10-CM

## 2016-05-20 DIAGNOSIS — E669 Obesity, unspecified: Secondary | ICD-10-CM

## 2016-05-20 MED ORDER — DOXYCYCLINE MONOHYDRATE 50 MG PO CAPS: 100.0000 mg | ORAL_CAPSULE | Freq: Two times a day (BID) | ORAL | 0 refills | Status: DC

## 2016-05-20 NOTE — Patient Instructions (Addendum)
If facial pain no better after 5 days on antibiotics, or if worse sooner, let me know. Try Ibuprofen 800 mg every 6 hours for pain.    12 weeks after we started Qsymia 11.25-69 mg put us at follow-up 07/15/2016. By that point, our weight loss goal will be loss of 3-5% of your baseline weight of 180 lbs when Qsymia was started, so goal weight for that visit is 174-171 lbs.

## 2016-05-20 NOTE — Progress Notes (Signed)
HPI: Kathleen Ortega is a 40 y.o. female  who presents to [facility] today, 05/20/16,  for chief complaint of:  Chief Complaint  Patient presents with  . Follow-up    WEIGHT LOSS  . Sinus Problem    Sinus . Location: R side of sinuses, ethmoid and maxillarr . Duration: 1 day ago, but sinus headaches on and off . Modifying factors: Advil PM, leftover Tramadol, warm compresses, nothing helps . Assoc signs/symptoms: sneezing a lot lately, has been at the hospital with sick relative. (+) seasonal allergies   Obesity - doing well on Qsymia, a bit early to be following up for weight check at this point.    Past medical, surgical, social and family history reviewed: Past Medical History:  Diagnosis Date  . IBS (irritable bowel syndrome)   . Irregular periods/menstrual cycles 10/10/2015  . Obesity 01/31/2016   Phentermine started 09/2015  . Seasonal allergies    Past Surgical History:  Procedure Laterality Date  . CHOLECYSTECTOMY    . LASIK     Social History  Substance Use Topics  . Smoking status: Never Smoker  . Smokeless tobacco: Not on file  . Alcohol use Yes   Family History  Problem Relation Age of Onset  . Hypertension Mother   . Hyperlipidemia Father   . Alcohol abuse Brother   . Diabetes Maternal Grandmother   . Stroke Maternal Grandfather   . Cancer Paternal Grandmother     breast     Current medication list and allergy/intolerance information reviewed:   Current Outpatient Prescriptions  Medication Sig Dispense Refill  . ALPRAZolam (XANAX) 0.5 MG tablet Take 0.5 mg by mouth as needed for anxiety.    . Benzoyl Peroxide 5 % lotion APPLY TOPICALLY TO AFFECTED AREA EVERY NIGHT, WASH OFF IN MORNING. NOTE - MAY BLEACH CLOTHING/LINENS 30 mL 0  . norgestimate-ethinyl estradiol (ORTHO-CYCLEN,SPRINTEC,PREVIFEM) 0.25-35 MG-MCG tablet Take 1 tablet by mouth daily. Use as directed. 1 Package 11  . Phentermine-Topiramate 11.25-69 MG CP24  Take 1 tablet by mouth every morning. 30 capsule 0   No current facility-administered medications for this visit.    Allergies  Allergen Reactions  . Codeine Hives and Swelling  . Erythromycin Hives and Swelling  . Penicillins Hives and Swelling  . Sulfa Antibiotics Hives and Swelling  . Sulfamethoxazole-Trimethoprim Hives and Swelling      Review of Systems:  Constitutional:  No  fever, no chills, (+) recent illness, No unintentional weight changes. No significant fatigue.   HEENT: (+) R sinus headache, no vision change, no hearing change, No sore throat, (+) sinus pressure  Cardiac: No  chest pain  Respiratory:  No  shortness of breath. No  Cough  Exam:  BP 115/63   Pulse 72   Ht 5\' 6"  (1.676 m)   Wt 178 lb (80.7 kg)   BMI 28.73 kg/m   Constitutional: VS see above. General Appearance: alert, well-developed, well-nourished, NAD  Eyes: Normal lids and conjunctive, non-icteric sclera  Ears, Nose, Mouth, Throat: MMM, Normal external inspection ears/nares/mouth/lips/gums. TM normal bilaterally. Pharynx/tonsils no erythema, no exudate. Nasal mucosa normal. (+) tenderness to percussion R maxillary sinuses  Neck: No masses, trachea midline. No thyroid enlargement. No tenderness/mass appreciated. No lymphadenopathy  Respiratory: Normal respiratory effort. no wheeze, no rhonchi, no rales  Cardiovascular: S1/S2 normal, no murmur, no rub/gallop auscultated. RRR.    ASSESSMENT/PLAN:  Acute maxillary sinusitis, recurrence not specified - Plan: doxycycline (MONODOX) 50 MG capsule - some consideration for  dental pain but no ginvigitis or caries or abscess noted, consider CT or dental referral if no better  Obesity - see below.     Patient Instructions  If facial pain no better after 5 days on antibiotics, or if worse sooner, let me know. Try Ibuprofen 800 mg every 6 hours for pain.    12 weeks after we started Qsymia 11.25-69 mg put us at follow-up 07/15/2016. By that  point, our weight loss goal will be loss of 3-5% of your baseline weight of 180 lbs when Qsymia was started, so goal weight for that visit is 174-171 lbs.      Visit summary with medication list and pertinent instructions was printed for patient to review. All questions at time of visit were answered - patient instructed to contact office with any additional concerns. ER/RTC precautions were reviewed with the patient. Follow-up plan: Return in about 8 weeks (around 07/15/2016), or sooner if needed, for WEIGHT CHECK.

## 2016-06-24 ENCOUNTER — Other Ambulatory Visit: Payer: Self-pay | Admitting: Family Medicine

## 2016-06-24 DIAGNOSIS — E669 Obesity, unspecified: Secondary | ICD-10-CM

## 2016-07-15 ENCOUNTER — Ambulatory Visit (INDEPENDENT_AMBULATORY_CARE_PROVIDER_SITE_OTHER): Payer: BC Managed Care – PPO | Admitting: Osteopathic Medicine

## 2016-07-15 DIAGNOSIS — E669 Obesity, unspecified: Secondary | ICD-10-CM | POA: Diagnosis not present

## 2016-07-15 NOTE — Progress Notes (Signed)
Pt in for weight check for Qsymia.  She has lost 2lbs since last weight taken.

## 2016-07-16 MED ORDER — PHENTERMINE-TOPIRAMATE ER 11.25-69 MG PO CP24: 1.0000 | ORAL_CAPSULE | Freq: Every morning | ORAL | 3 refills | Status: DC

## 2016-07-16 NOTE — Progress Notes (Signed)
BP 122/78   Pulse 96   Ht 5\' 6"  (1.676 m)   Wt 176 lb (79.8 kg)   BMI 28.41 kg/m   Current Outpatient Prescriptions on File Prior to Visit  Medication Sig Dispense Refill  . ALPRAZolam (XANAX) 0.5 MG tablet Take 0.5 mg by mouth as needed for anxiety.    . Benzoyl Peroxide 5 % lotion APPLY TOPICALLY TO AFFECTED AREA EVERY NIGHT, WASH OFF IN MORNING. NOTE - MAY BLEACH CLOTHING/LINENS 30 mL 0  . doxycycline (MONODOX) 50 MG capsule Take 2 capsules (100 mg total) by mouth 2 (two) times daily. 14 capsule 0  . norgestimate-ethinyl estradiol (ORTHO-CYCLEN,SPRINTEC,PREVIFEM) 0.25-35 MG-MCG tablet Take 1 tablet by mouth daily. Use as directed. 1 Package 11  . Phentermine-Topiramate (QSYMIA) 11.25-69 MG CP24 Take 1 capsule by mouth every morning. NEED FOLLOW UP VISIT FOR MORE REFILLS 30 capsule 0   No current facility-administered medications on file prior to visit.    Kathleen Ortega was seen today for abnormal weight gain.  Diagnoses and all orders for this visit:  Class 1 obesity without serious comorbidity in adult, unspecified BMI, unspecified obesity type -     Phentermine-Topiramate (QSYMIA) 11.25-69 MG CP24; Take 1 capsule by mouth every morning.

## 2016-08-18 IMAGING — CR DG CHEST 2V
2 series · 2 of 2 positions shown · non-contrast
Comparison: None.

CLINICAL DATA: Three-week history of dry cough.  Nonsmoker.

EXAM:
CHEST  2 VIEW

[view not recorded (1 of 2)]
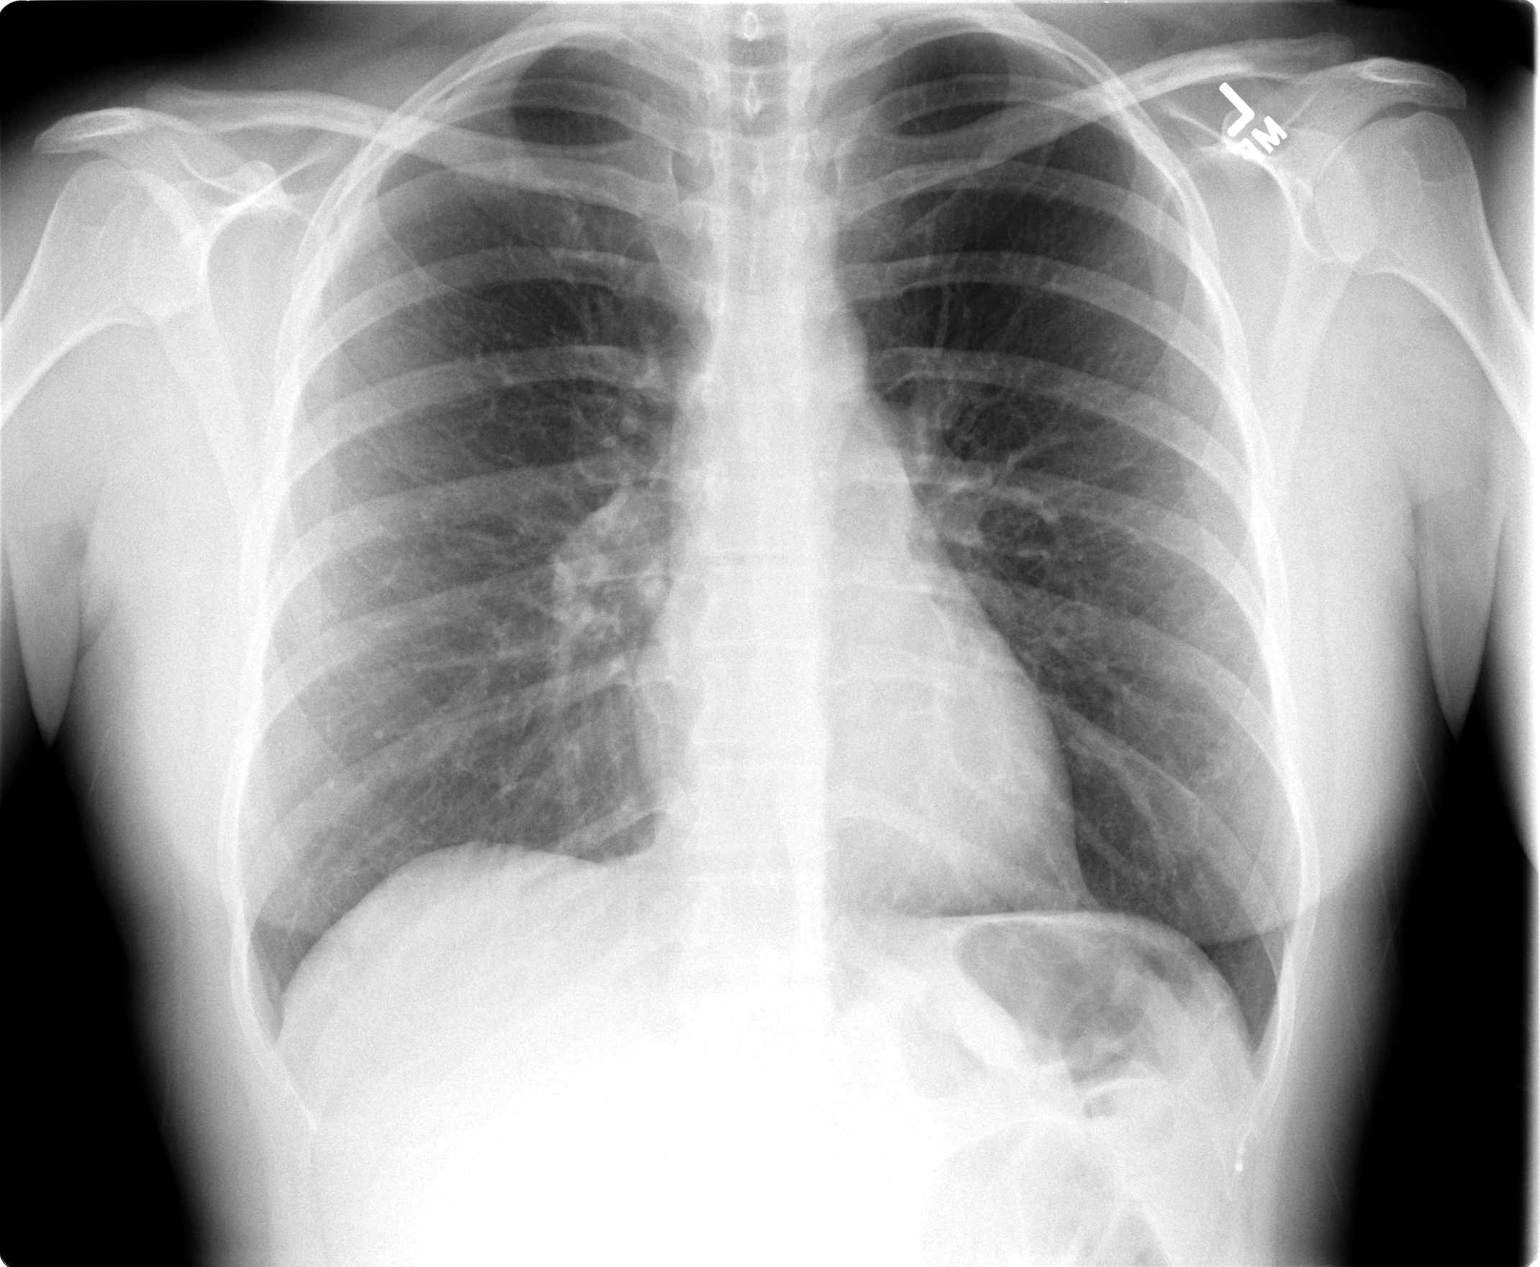

[view not recorded (2 of 2)]
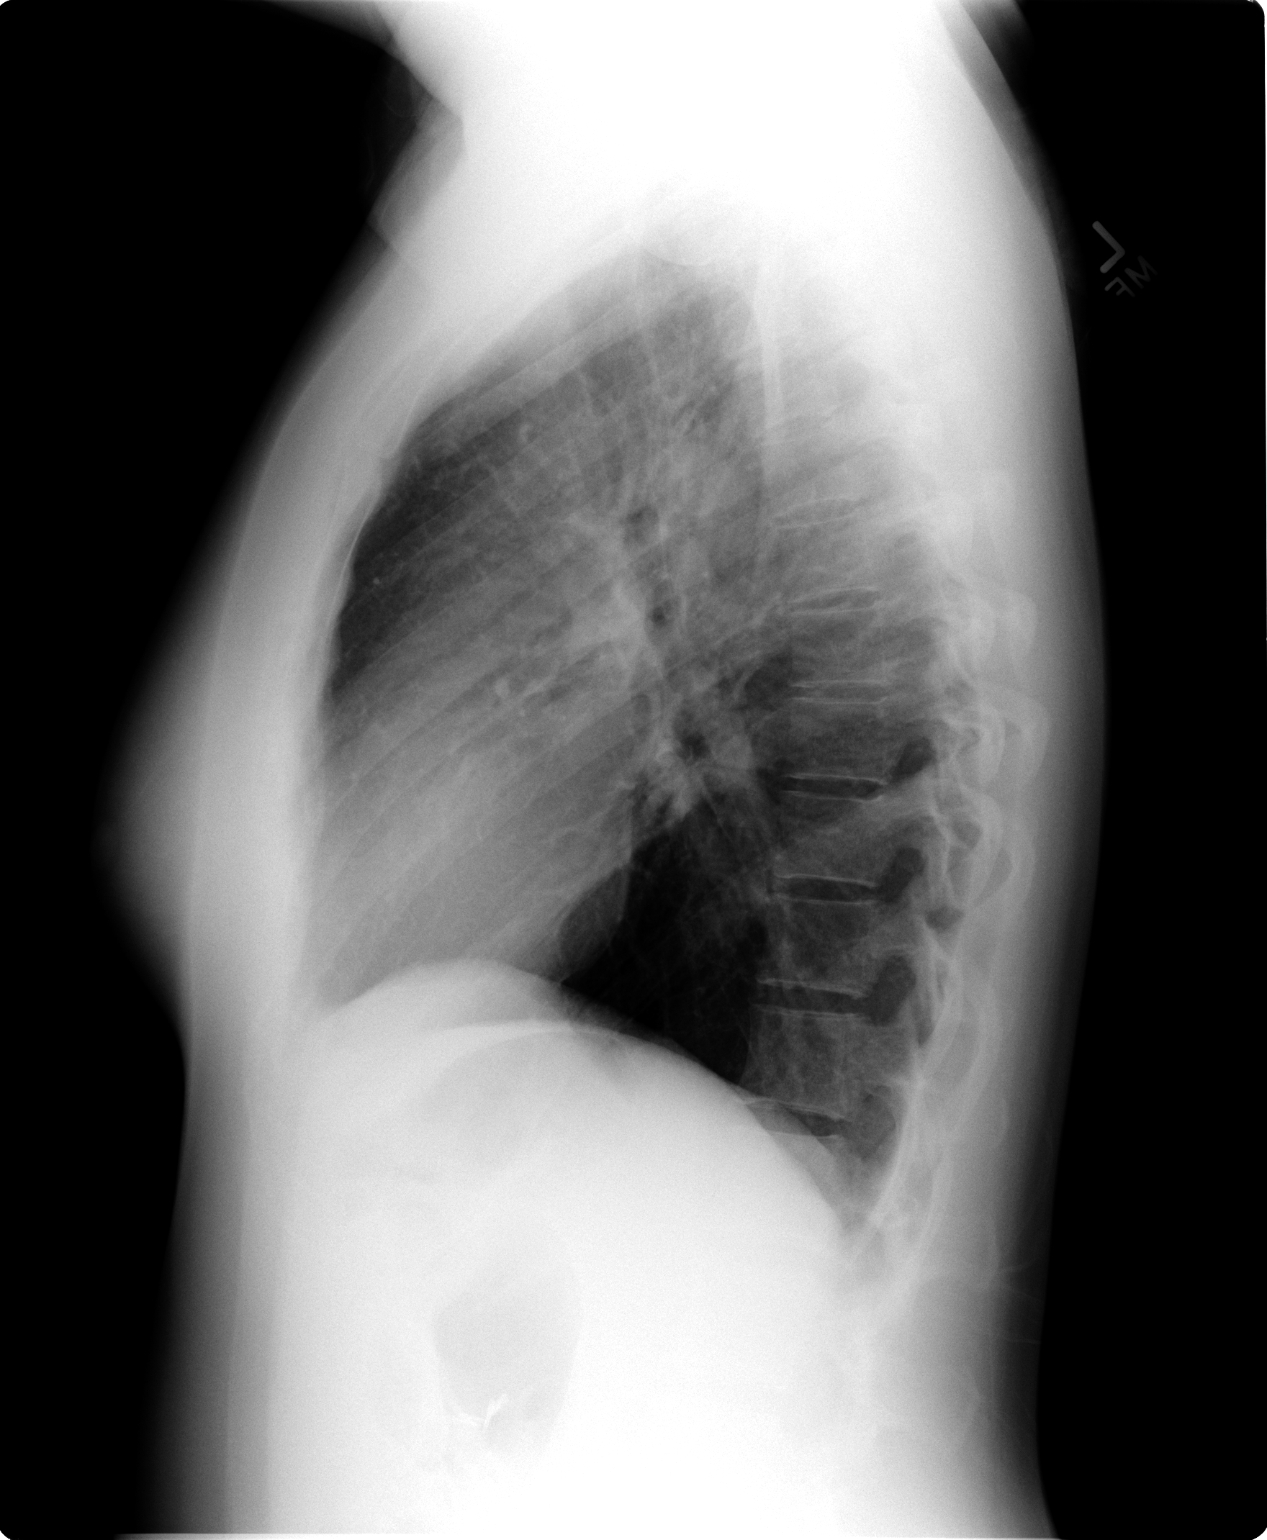

[2 of 2 positions shown; findings below may reference images not displayed]

FINDINGS: The heart size and mediastinal contours are within normal limits.
Both lungs are clear. No pleural effusion or pneumothorax. The
visualized skeletal structures are unremarkable.
IMPRESSION: No active cardiopulmonary disease.

## 2016-08-26 ENCOUNTER — Telehealth: Payer: Self-pay | Admitting: *Deleted

## 2016-08-26 ENCOUNTER — Encounter: Payer: Self-pay | Admitting: Osteopathic Medicine

## 2016-08-26 NOTE — Telephone Encounter (Signed)
[facility] Case ID: [id number]

## 2016-09-01 ENCOUNTER — Encounter: Payer: Self-pay | Admitting: Osteopathic Medicine

## 2016-09-02 NOTE — Telephone Encounter (Signed)
Deniedon November 22  CaseId:[id number];Product Name:Weight loss drugs - Qsymia - PA ESI;Status:Denied;Appeal Information: Attention:ATTN: CLINICAL APPEALS DEPARTMENT [employer] [phone],[address] [phone] Fax:[phone];   Not sure why it was denied other than patient does not have hypertension,diabetes,sleep apnea. She does though have PCOS which can make it hard for her to lose weight.  Appeal sent

## 2016-09-03 ENCOUNTER — Encounter: Payer: Self-pay | Admitting: *Deleted

## 2016-09-17 NOTE — Telephone Encounter (Signed)
Checked on appeal and rep states decsion is in progress but will fax determination by Dec 15

## 2016-09-18 NOTE — Telephone Encounter (Signed)
Received a call from [employer] company today and they needed some additional questions answered so they can finish up their determination. I answered the remaining clinical questions and now waiting on response. - CF

## 2016-09-18 NOTE — Telephone Encounter (Signed)
Received fax form [facility] and Qsymia has been approved from 08/20/2016 - 09/18/2017. Case ID: [id number]. I will call and notify pharmacy. - CF

## 2016-09-23 ENCOUNTER — Encounter: Payer: Self-pay | Admitting: Osteopathic Medicine

## 2016-09-24 ENCOUNTER — Encounter: Payer: Self-pay | Admitting: Osteopathic Medicine

## 2016-09-25 ENCOUNTER — Telehealth: Payer: Self-pay | Admitting: Osteopathic Medicine

## 2016-09-25 NOTE — Telephone Encounter (Signed)
Hi Cindy,  I'm not sure what to make of the problem is going on with this patient's Qsymia - she says that the pharmacy has a higher dose of the medicine on file than what was approved with the prior authorization, I think? Can we look into this a little further and address this? Please see the last prescription that I sent back in October, from our standpoint and not sure what's going on her why the one that I send in October wasn't approved or if it was in it's just going to be expensive for her.

## 2016-09-25 NOTE — Telephone Encounter (Signed)
Patient states her pharmacy will not fill the Qsymia because we sent too low a dose? I sent the 11.75-6 mg Rx in October, please call pharmacy and find out what they need form me so this patient can get her medication?

## 2016-09-25 NOTE — Telephone Encounter (Signed)
I am resubmitting prior authorization with correct dosage - CF

## 2016-09-25 NOTE — Telephone Encounter (Signed)
Called [facility] pharmacy and was told that the only Rx  Dosage they have on file for patient is Qsymia 11.25-69 mg. She stated that this is the same dose that patient has been on since June. And the last Rx was filled December  And it will cost the patient $150 dollars.  Rhonda Cunningham,CMA

## 2016-10-02 ENCOUNTER — Encounter: Payer: Self-pay | Admitting: Emergency Medicine

## 2016-10-02 ENCOUNTER — Emergency Department (INDEPENDENT_AMBULATORY_CARE_PROVIDER_SITE_OTHER)
Admission: EM | Admit: 2016-10-02 | Discharge: 2016-10-02 | Disposition: A | Discharge status: Home / Self Care | Payer: BC Managed Care – PPO | Source: Home / Self Care | Attending: Family Medicine | Admitting: Family Medicine

## 2016-10-02 DIAGNOSIS — B9789 Other viral agents as the cause of diseases classified elsewhere: Secondary | ICD-10-CM | POA: Diagnosis not present

## 2016-10-02 DIAGNOSIS — J069 Acute upper respiratory infection, unspecified: Secondary | ICD-10-CM

## 2016-10-02 MED ORDER — BENZONATATE 100 MG PO CAPS: 100.0000 mg | ORAL_CAPSULE | Freq: Three times a day (TID) | ORAL | 0 refills | Status: DC

## 2016-10-02 MED ORDER — IPRATROPIUM BROMIDE 0.06 % NA SOLN: 2.0000 | Freq: Four times a day (QID) | NASAL | 0 refills | Status: DC

## 2016-10-02 NOTE — ED Triage Notes (Signed)
Cough, congestion, burning in her chest x 1 week, getting wose

## 2016-10-02 NOTE — ED Provider Notes (Signed)
CSN: [id number]     Arrival date & time 10/02/16  0932 History   First MD Initiated Contact with Patient 10/02/16 [id number]     Chief Complaint  Patient presents with  . Cough   (Consider location/radiation/quality/duration/timing/severity/associated sxs/prior Treatment) HPI Wyman SongsterFarrah H Vandevelde is a 40 y.o. female presenting to UC with c/o 1 week of burning in her chest with cough and congestion.  Cough is mildly productive, gradually worsening.  She has been taking OTC Tylenol Cold with mild temporary relief.  Pt notes it burns to swallow cold liquids.  Hx of acid reflux.  She was on omeprazole but that prescription ran out.  She has tried OTC antiacid medications intermittently.  Denies fever, chills, n/v/d. Pt notes her husband has a cold and her daughter has needed a breathing treatment.  Denies hx of asthma or COPD.    Past Medical History:  Diagnosis Date  . IBS (irritable bowel syndrome)   . Irregular periods/menstrual cycles 10/10/2015  . Obesity 01/31/2016   Phentermine started 09/2015  . Seasonal allergies    Past Surgical History:  Procedure Laterality Date  . CHOLECYSTECTOMY    . LASIK     Family History  Problem Relation Age of Onset  . Hypertension Mother   . Hyperlipidemia Father   . Alcohol abuse Brother   . Diabetes Maternal Grandmother   . Stroke Maternal Grandfather   . Cancer Paternal Grandmother     breast   Social History  Substance Use Topics  . Smoking status: Never Smoker  . Smokeless tobacco: Never Used  . Alcohol use Yes   OB History    No data available     Review of Systems  Constitutional: Negative for chills and fever.  HENT: Positive for congestion, postnasal drip, rhinorrhea and sore throat ( mild). Negative for ear pain, trouble swallowing and voice change.   Respiratory: Positive for cough. Negative for shortness of breath.   Cardiovascular: Positive for chest pain ( soresness from cough). Negative for palpitations.  Gastrointestinal: Negative  for abdominal pain, diarrhea, nausea and vomiting.  Musculoskeletal: Negative for arthralgias, back pain and myalgias.  Skin: Negative for rash.    Allergies  Codeine; Erythromycin; Penicillins; Sulfa antibiotics; and Sulfamethoxazole-trimethoprim  Home Medications   Prior to Admission medications   Medication Sig Start Date End Date Taking? Authorizing Provider  ALPRAZolam Prudy Feeler(XANAX) 0.5 MG tablet Take 0.5 mg by mouth as needed for anxiety.    Historical Provider, MD  benzonatate (TESSALON) 100 MG capsule Take 1-2 capsules (100-200 mg total) by mouth every 8 (eight) hours. 10/02/16   Junius FinnerErin O'Malley, PA-C  Benzoyl Peroxide 5 % lotion APPLY TOPICALLY TO AFFECTED AREA EVERY NIGHT, WASH OFF IN MORNING. NOTE - MAY BLEACH CLOTHING/LINENS 05/24/15   Sunnie NielsenNatalie Alexander, DO  doxycycline (MONODOX) 50 MG capsule Take 2 capsules (100 mg total) by mouth 2 (two) times daily. 05/20/16   Sunnie NielsenNatalie Alexander, DO  ipratropium (ATROVENT) 0.06 % nasal spray Place 2 sprays into both nostrils 4 (four) times daily. 10/02/16   Junius FinnerErin O'Malley, PA-C  norgestimate-ethinyl estradiol (ORTHO-CYCLEN,SPRINTEC,PREVIFEM) 0.25-35 MG-MCG tablet Take 1 tablet by mouth daily. Use as directed. 01/31/16   Sunnie NielsenNatalie Alexander, DO  Phentermine-Topiramate (QSYMIA) 11.25-69 MG CP24 Take 1 capsule by mouth every morning. 07/16/16   Sunnie NielsenNatalie Alexander, DO   Meds Ordered and Administered this Visit  Medications - No data to display  BP 110/72 (BP Location: Left Arm)   Pulse 72   Temp 98.4 F (36.9 C) (Oral)   Ht  5\' 6"  (1.676 m)   Wt 182 lb (82.6 kg)   SpO2 100%   BMI 29.38 kg/m  No data found.   Physical Exam  Constitutional: She appears well-developed and well-nourished. No distress.  HENT:  Head: Normocephalic and atraumatic.  Right Ear: Tympanic membrane normal.  Left Ear: Tympanic membrane normal.  Nose: Nose normal.  Mouth/Throat: Uvula is midline, oropharynx is clear and moist and mucous membranes are normal.  Eyes:  Conjunctivae are normal. No scleral icterus.  Neck: Normal range of motion. Neck supple.  Cardiovascular: Normal rate, regular rhythm and normal heart sounds.   Pulmonary/Chest: Effort normal and breath sounds normal. No respiratory distress. She has no wheezes. She has no rales. She exhibits no tenderness.  Intermittent, minimally productive cough on exam. No respiratory distress. Lungs: CTAB  Musculoskeletal: Normal range of motion.  Neurological: She is alert.  Skin: Skin is warm and dry. She is not diaphoretic.  Nursing note and vitals reviewed.   Urgent Care Course   Clinical Course     Procedures (including critical care time)  Labs Review Labs Reviewed - No data to display  Imaging Review No results found.   MDM   1. Viral URI with cough    Pt c/o cough for 1 week. Lungs: CTAB Pt is afebrile.  No evidence of bacterial infection at this time. Encouraged symptomatic treatment. Rx: Tessalon and ipratropium nasal spray. Encouraged to take OTC guaifenesin, stay well hydrated and rested. Pt allergic to PCNs and macrolides, will hold off on empiric antibiotic treatment and have pt f/u with PCP or return to UC if needed in 1 week if not improving.  Patient verbalized understanding and agreement with treatment plan.     Junius Finnerrin O'Malley, PA-C 10/02/16 1013

## 2016-10-02 NOTE — Discharge Instructions (Signed)
°  For the Ipratropium nasal spray, be sure to use as prescribed to help prevent post-nasal drip, which can trigger coughing, especially at night.  Use 2 sprays per nostril 4 times daily while sick.  You should spray one time in each nostril pointing the spray to the out portion of your nostril, breath in slowly while spraying. Wait about 30 seconds to 1 minute before given the second spray in each nostril.  This will ensure you get the most benefit from each spray.   If this medication dries out your nose or sinuses too much, you may use 1-2 times daily for 1 week.

## 2016-10-08 ENCOUNTER — Encounter: Payer: Self-pay | Admitting: Osteopathic Medicine

## 2016-10-11 ENCOUNTER — Other Ambulatory Visit: Payer: Self-pay | Admitting: Osteopathic Medicine

## 2016-10-11 DIAGNOSIS — E282 Polycystic ovarian syndrome: Secondary | ICD-10-CM

## 2016-11-03 ENCOUNTER — Encounter: Payer: Self-pay | Admitting: Osteopathic Medicine

## 2016-11-03 DIAGNOSIS — K219 Gastro-esophageal reflux disease without esophagitis: Secondary | ICD-10-CM | POA: Insufficient documentation

## 2016-11-27 ENCOUNTER — Other Ambulatory Visit: Payer: Self-pay | Admitting: Osteopathic Medicine

## 2016-11-27 DIAGNOSIS — E282 Polycystic ovarian syndrome: Secondary | ICD-10-CM

## 2016-12-18 ENCOUNTER — Other Ambulatory Visit: Payer: Self-pay | Admitting: Osteopathic Medicine

## 2016-12-18 DIAGNOSIS — E282 Polycystic ovarian syndrome: Secondary | ICD-10-CM

## 2017-01-13 ENCOUNTER — Other Ambulatory Visit: Payer: Self-pay | Admitting: Osteopathic Medicine

## 2017-01-13 DIAGNOSIS — E282 Polycystic ovarian syndrome: Secondary | ICD-10-CM

## 2017-01-18 LAB — HM MAMMOGRAPHY

## 2017-01-21 ENCOUNTER — Telehealth: Payer: Self-pay | Admitting: Osteopathic Medicine

## 2017-01-21 ENCOUNTER — Ambulatory Visit: Payer: BC Managed Care – PPO | Admitting: Osteopathic Medicine

## 2017-01-21 NOTE — Telephone Encounter (Signed)
Please call patient: Received mammogram results from [facility]. Her mammogram showed dense breast tissue and no concerns for cancer. Recommend routine screening every 1-2 years with mammogram

## 2017-01-21 NOTE — Telephone Encounter (Signed)
Left detailed message on patient vm with results as noted below. Rhonda Cunningham,CMA  

## 2017-01-25 ENCOUNTER — Encounter: Payer: Self-pay | Admitting: Osteopathic Medicine

## 2017-01-29 ENCOUNTER — Ambulatory Visit (INDEPENDENT_AMBULATORY_CARE_PROVIDER_SITE_OTHER): Payer: BC Managed Care – PPO | Admitting: Osteopathic Medicine

## 2017-01-29 VITALS — BP 108/70 | HR 81 | Resp 16 | Wt 192.5 lb

## 2017-01-29 DIAGNOSIS — Z1389 Encounter for screening for other disorder: Secondary | ICD-10-CM

## 2017-01-29 DIAGNOSIS — E669 Obesity, unspecified: Secondary | ICD-10-CM | POA: Diagnosis not present

## 2017-01-29 DIAGNOSIS — E282 Polycystic ovarian syndrome: Secondary | ICD-10-CM

## 2017-01-29 DIAGNOSIS — Z9049 Acquired absence of other specified parts of digestive tract: Secondary | ICD-10-CM

## 2017-01-29 DIAGNOSIS — Z1331 Encounter for screening for depression: Secondary | ICD-10-CM

## 2017-01-29 MED ORDER — NORGESTIMATE-ETH ESTRADIOL 0.25-35 MG-MCG PO TABS: 1.0000 | ORAL_TABLET | Freq: Every day | ORAL | 3 refills | Status: DC

## 2017-01-29 MED ORDER — PHENTERMINE-TOPIRAMATE ER 11.25-69 MG PO CP24: 1.0000 | ORAL_CAPSULE | Freq: Every morning | ORAL | 2 refills | Status: DC

## 2017-01-29 NOTE — Progress Notes (Signed)
HPI: Kathleen Ortega is a 41 y.o. female  who presents to [facility] today, 01/29/17,  for chief complaint of:  Chief Complaint  Patient presents with  . Follow-up    Weight/GI Phentermine started 09/2015 at 192 lbs, transition to Qsymia 01/31/16, 03/23/2016 180 lbs, went up on dose. 05/20/16 178 lbs on Qsymia 11.25-69. 07/15/16 176 lbs = 9% weight loss from initial weight, continue current dose. Today 01/29/17  192 lbs = back at initial weight, was 206 earlier this winter   Having some GI issues over the winter - took off all medications, bile buildup, hx cholecystectomy. Following up in 02/2017. Currently no abdominal pain. Doxepin has helped.  Refill birth control: Pap last fall at [facility], need records.   (+)Depression screening: Patient reports some situational difficulties with children, has been. Overall good home life but stressful keeping everything together.    Past medical history, surgical history, social history and family history reviewed.  Patient Active Problem List   Diagnosis Date Noted  . GERD (gastroesophageal reflux disease) 11/03/2016  . Obesity 01/31/2016  . Irregular periods/menstrual cycles 10/10/2015  . Medication management 10/10/2015  . Current tear of meniscus 05/24/2015  . History of cholecystectomy 01/13/2013  . Allergic rhinitis 10/04/2011  . Cephalalgia 10/04/2011  . Cannot sleep 10/04/2011    Current medication list and allergy/intolerance information reviewed.   Current Outpatient Prescriptions on File Prior to Visit  Medication Sig Dispense Refill  . ALPRAZolam (XANAX) 0.5 MG tablet Take 0.5 mg by mouth as needed for anxiety.    . Benzoyl Peroxide 5 % lotion APPLY TOPICALLY TO AFFECTED AREA EVERY NIGHT, WASH OFF IN MORNING. NOTE - MAY BLEACH CLOTHING/LINENS 30 mL 0   No current facility-administered medications on file prior to visit.    Allergies  Allergen Reactions  . Codeine Hives and Swelling  .  Erythromycin Hives and Swelling  . Penicillins Hives and Swelling  . Sulfa Antibiotics Hives and Swelling  . Sulfamethoxazole-Trimethoprim Hives and Swelling      Review of Systems:  Constitutional: No recent illness, feels well today   Cardiac: No  chest pain, No  pressure, No palpitations  Respiratory:  No  shortness of breath. No  Cough  Gastrointestinal: No  abdominal pain at this time, no change on bowel habits  Neurologic: No  weakness, No  Dizziness  Psychiatric: No  concerns with depression, +concerns with anxiety, +sleep problems  Exam:  BP 108/70   Pulse 81   Resp 16   Wt 192 lb 8 oz (87.3 kg)   BMI 31.07 kg/m   Constitutional: VS see above. General Appearance: alert, well-developed, well-nourished, NAD  Neck: No masses, trachea midline.   Respiratory: Normal respiratory effort.   Neurological: Normal balance/coordination. No tremor.  Skin: warm, dry, intact.   Psychiatric: Normal judgment/insight. Normal mood and affect. Oriented x3.    Recent Results (from the past 2160 hour(s))  [facility] MAMMOGRAPHY     Status: None   Collection Time: 01/18/17 12:00 AM  Result Value Ref Range   HM Mammogram 0-4 Bi-Rad 0-4 Bi-Rad, Self Reported Normal    Comment: [facility]    ASSESSMENT/PLAN:   Class 1 obesity without serious comorbidity in adult, unspecified BMI, unspecified obesity type - We'll restart Qsymia, if problems can go back to low dose and titrate up - Plan: Phentermine-Topiramate (QSYMIA) 11.25-69 MG CP24  PCOS (polycystic ovarian syndrome) - Plan: norgestimate-ethinyl estradiol (MONONESSA) 0.25-35 MG-MCG tablet  Positive depression screening - Situational factors,  family stress. Patient not interested in medication management at this time, precautions reviewed  Status post cholecystectomy - Complications with bile buildup causing some abdominal pain, following with GI, patient improved on doxepin    Follow-up plan: Return in about 3 months (around  04/30/2017) for ANNUAL PHYSICAL and weight check on Qsymia .  Visit summary with medication list and pertinent instructions was printed for patient to review, alert [place] if any changes needed. All questions at time of visit were answered - patient instructed to contact office with any additional concerns. ER/RTC precautions were reviewed with the patient and understanding verbalized.

## 2017-02-03 ENCOUNTER — Telehealth: Payer: Self-pay | Admitting: Osteopathic Medicine

## 2017-02-03 NOTE — Telephone Encounter (Signed)
Please call patient: I got her most recent Pap smear results from OB/GYN. Looks like was last done September 2013, she will be due again for repeat Pap in September of this year if she would like to schedule an appointment.

## 2017-02-03 NOTE — Telephone Encounter (Signed)
Spoke to patient gave her information as noted below. Patient has an appt in June so she will schedule her appointment during that time. Ervan Heber,CMA

## 2017-02-11 DIAGNOSIS — K588 Other irritable bowel syndrome: Secondary | ICD-10-CM | POA: Insufficient documentation

## 2017-02-11 DIAGNOSIS — K2100 Gastro-esophageal reflux disease with esophagitis, without bleeding: Secondary | ICD-10-CM | POA: Insufficient documentation

## 2017-02-23 ENCOUNTER — Encounter: Payer: Self-pay | Admitting: Osteopathic Medicine

## 2017-02-23 DIAGNOSIS — R5383 Other fatigue: Secondary | ICD-10-CM

## 2017-02-26 ENCOUNTER — Encounter: Payer: Self-pay | Admitting: Osteopathic Medicine

## 2017-04-21 ENCOUNTER — Encounter: Payer: BC Managed Care – PPO | Admitting: Osteopathic Medicine

## 2017-04-23 LAB — CBC
HCT: 40.6 % (ref 35.0–45.0)
Hemoglobin: 13.2 g/dL (ref 11.7–15.5)
MCH: 30 pg (ref 27.0–33.0)
MCHC: 32.5 g/dL (ref 32.0–36.0)
MCV: 92.3 fL (ref 80.0–100.0)
MPV: 9.5 fL (ref 7.5–12.5)
PLATELETS: 340 10*3/uL (ref 140–400)
RBC: 4.4 MIL/uL (ref 3.80–5.10)
RDW: 13.4 % (ref 11.0–15.0)
WBC: 7.9 10*3/uL (ref 3.8–10.8)

## 2017-04-24 LAB — COMPLETE METABOLIC PANEL WITH GFR
ALT: 17 U/L (ref 6–29)
AST: 15 U/L (ref 10–30)
Albumin: 3.7 g/dL (ref 3.6–5.1)
Alkaline Phosphatase: 66 U/L (ref 33–115)
BUN: 12 mg/dL (ref 7–25)
CO2: 21 mmol/L (ref 20–31)
Calcium: 8.6 mg/dL (ref 8.6–10.2)
Chloride: 108 mmol/L (ref 98–110)
Creat: 0.9 mg/dL (ref 0.50–1.10)
GFR, EST NON AFRICAN AMERICAN: 80 mL/min (ref >=60)
GFR, Est African American: >89 mL/min (ref >=60)
GLUCOSE: 79 mg/dL (ref 65–99)
Potassium: 4 mmol/L (ref 3.5–5.3)
SODIUM: 138 mmol/L (ref 135–146)
TOTAL PROTEIN: 6.3 g/dL (ref 6.1–8.1)
Total Bilirubin: 0.3 mg/dL (ref 0.2–1.2)

## 2017-04-24 LAB — LIPID PANEL
CHOL/HDL RATIO: 3.2 ratio (ref <5.0)
CHOLESTEROL: 231 mg/dL — AB (ref <200)
HDL: 73 mg/dL (ref >50)
LDL Cholesterol: 139 mg/dL — ABNORMAL HIGH (ref <100)
Triglycerides: 93 mg/dL (ref <150)
VLDL: 19 mg/dL (ref <30)

## 2017-04-24 LAB — TSH: TSH: 2.14 mIU/L

## 2017-04-24 LAB — T4, FREE: FREE T4: 1.1 ng/dL (ref 0.8–1.8)

## 2017-04-24 LAB — VITAMIN D 25 HYDROXY (VIT D DEFICIENCY, FRACTURES): Vit D, 25-Hydroxy: 36 ng/mL (ref 30–100)

## 2017-04-26 ENCOUNTER — Other Ambulatory Visit (HOSPITAL_COMMUNITY)
Admission: RE | Admit: 2017-04-26 | Discharge: 2017-04-26 | Disposition: A | Discharge status: Home / Self Care | Payer: BC Managed Care – PPO | Source: Ambulatory Visit | Attending: Osteopathic Medicine | Admitting: Osteopathic Medicine

## 2017-04-26 ENCOUNTER — Encounter: Payer: Self-pay | Admitting: Osteopathic Medicine

## 2017-04-26 ENCOUNTER — Ambulatory Visit (INDEPENDENT_AMBULATORY_CARE_PROVIDER_SITE_OTHER): Payer: BC Managed Care – PPO | Admitting: Osteopathic Medicine

## 2017-04-26 VITALS — BP 107/71 | HR 74 | Ht 66.0 in | Wt 190.0 lb

## 2017-04-26 DIAGNOSIS — E66811 Obesity, class 1: Secondary | ICD-10-CM

## 2017-04-26 DIAGNOSIS — Z01419 Encounter for gynecological examination (general) (routine) without abnormal findings: Secondary | ICD-10-CM

## 2017-04-26 DIAGNOSIS — Z23 Encounter for immunization: Secondary | ICD-10-CM

## 2017-04-26 DIAGNOSIS — E7800 Pure hypercholesterolemia, unspecified: Secondary | ICD-10-CM | POA: Diagnosis present

## 2017-04-26 DIAGNOSIS — E669 Obesity, unspecified: Secondary | ICD-10-CM | POA: Diagnosis not present

## 2017-04-26 DIAGNOSIS — Z0000 Encounter for general adult medical examination without abnormal findings: Secondary | ICD-10-CM

## 2017-04-26 MED ORDER — PHENTERMINE-TOPIRAMATE ER 11.25-69 MG PO CP24: 1.0000 | ORAL_CAPSULE | Freq: Every morning | ORAL | 2 refills | Status: DC

## 2017-04-26 NOTE — Patient Instructions (Signed)
   Will have Pap results back within one week - let us know if you haven't heard back from us!   Recheck cholesterol labs and weight in 3 months - nurse visit and lab visit only

## 2017-04-26 NOTE — Progress Notes (Signed)
HPI: LORENNA Ortega is a 41 y.o. female  who presents to [facility] today, 04/26/17,  for chief complaint of:  Chief Complaint  Patient presents with  . Annual Exam  . Gynecologic Exam     Patient here for annual physical / wellness exam.  See preventive care reviewed as below.  Recent labs reviewed in detail with the patient.   Additional concerns today include:   None Not very successful with weight loss, not compliant with diet/lifestyle modifications over the summer.   Past medical, surgical, social and family history reviewed: Patient Active Problem List   Diagnosis Date Noted  . GERD (gastroesophageal reflux disease) 11/03/2016  . Obesity 01/31/2016  . Irregular periods/menstrual cycles 10/10/2015  . Medication management 10/10/2015  . Current tear of meniscus 05/24/2015  . History of cholecystectomy 01/13/2013  . Allergic rhinitis 10/04/2011  . Cephalalgia 10/04/2011  . Cannot sleep 10/04/2011   Past Surgical History:  Procedure Laterality Date  . CHOLECYSTECTOMY    . LASIK     Social History  Substance Use Topics  . Smoking status: Never Smoker  . Smokeless tobacco: Never Used  . Alcohol use Yes   Family History  Problem Relation Age of Onset  . Hypertension Mother   . Hyperlipidemia Father   . Alcohol abuse Brother   . Diabetes Maternal Grandmother   . Stroke Maternal Grandfather   . Cancer Paternal Grandmother        breast     Current medication list and allergy/intolerance information reviewed:   Current Outpatient Prescriptions  Medication Sig Dispense Refill  . ALPRAZolam (XANAX) 0.5 MG tablet Take 0.5 mg by mouth as needed for anxiety.    . Benzoyl Peroxide 5 % lotion APPLY TOPICALLY TO AFFECTED AREA EVERY NIGHT, WASH OFF IN MORNING. NOTE - MAY BLEACH CLOTHING/LINENS 30 mL 0  . doxepin (SINEQUAN) 10 MG capsule Take 10 mg by mouth at bedtime.  5  . norgestimate-ethinyl estradiol (MONONESSA) 0.25-35 MG-MCG  tablet Take 1 tablet by mouth daily. 3 Package 3  . omeprazole (PRILOSEC) 40 MG capsule Take 40 mg by mouth 2 (two) times daily.  3  . Phentermine-Topiramate (QSYMIA) 11.25-69 MG CP24 Take 1 capsule by mouth every morning. 30 capsule 2   No current facility-administered medications for this visit.    Allergies  Allergen Reactions  . Codeine Hives and Swelling  . Erythromycin Hives and Swelling  . Penicillins Hives and Swelling  . Sulfa Antibiotics Hives and Swelling  . Sulfamethoxazole-Trimethoprim Hives and Swelling      Review of Systems:  Constitutional:  No  fever, no chills, No recent illness, No unintentional weight changes. No significant fatigue.   HEENT: No  headache, no vision change  Cardiac: No  chest pain, No  pressure, No palpitations, No  Orthopnea  Respiratory:  No  shortness of breath. No  Cough  Gastrointestinal: No  abdominal pain, No  nausea, No  vomiting,   Musculoskeletal: No new myalgia/arthralgia  Genitourinary: No  incontinence, No  abnormal genital bleeding, No abnormal genital discharge  Skin: No  Rash, No other wounds/concerning lesions  Hem/Onc: No  easy bruising/bleeding  Endocrine: No cold intolerance,  No heat intolerance. No polyuria/polydipsia/polyphagia   Neurologic: No  weakness, No  dizziness, No  slurred speech/focal weakness/facial droop  Psychiatric: No  concerns with depression, No  concerns with anxiety, No sleep problems, No mood problems  Exam:  BP 107/71   Pulse 74   Ht 5'  6" (1.676 m)   Wt 190 lb (86.2 kg)   BMI 30.67 kg/m   Constitutional: VS see above. General Appearance: alert, well-developed, well-nourished, NAD  Eyes: Normal lids and conjunctive, non-icteric sclera  Ears, Nose, Mouth, Throat: MMM, Normal external inspection ears/nares/mouth/lips/gums.   Neck: No masses, trachea midline. No thyroid enlargement. No tenderness/mass appreciated. No lymphadenopathy  Respiratory: Normal respiratory effort. no  wheeze, no rhonchi, no rales  Cardiovascular: S1/S2 normal, no murmur, no rub/gallop auscultated. RRR.   Gastrointestinal: Nontender, no masses. No hepatomegaly, no splenomegaly. No hernia appreciated. Bowel sounds normal. Rectal exam deferred.   Musculoskeletal: Gait normal. No clubbing/cyanosis of digits.   Neurological: Normal balance/coordination. No tremor.   Skin: warm, dry, intact. No rash/ulcer.   Psychiatric: Normal judgment/insight. Normal mood and affect. Oriented x3.      ASSESSMENT/PLAN:   Annual physical exam  Pap smear, as part of routine gynecological examination - Plan: Cytology - PAP  Class 1 obesity without serious comorbidity in adult, unspecified BMI, unspecified obesity type - Plan: Phentermine-Topiramate (QSYMIA) 11.25-69 MG CP24  Elevated LDL cholesterol level - Plan: Lipid Panel  Need for Tdap vaccination - Plan: Tdap vaccine greater than or equal to 7yo IM      FEMALE PREVENTIVE CARE Updated 04/26/17   ANNUAL SCREENING/COUNSELING  Diet/Exercise - HEALTHY HABITS DISCUSSED TO DECREASE CV RISK History  Smoking Status  . Never Smoker  Smokeless Tobacco  . Never Used   History  Alcohol Use  . Yes   Depression screen PHQ 2/9 04/26/2017  Decreased Interest 0  Down, Depressed, Hopeless 0  PHQ - 2 Score 0  Altered sleeping -  Tired, decreased energy -  Change in appetite -  Feeling bad or failure about yourself  -  Trouble concentrating -  Moving slowly or fidgety/restless -  Suicidal thoughts -  PHQ-9 Score -    Domestic violence concerns - no  HTN SCREENING - SEE VITALS  SEXUAL HEALTH  Sexually active in the past year - Yes with female.  Need/want STI testing today? - no  Concerns about libido or pain with sex? - some L pelvic   Plans for pregnancy? - none  INFECTIOUS DISEASE SCREENING  HIV - does not need  GC/CT - does not need  HepC - DOB [birth year] - does not need  TB - does not need  DISEASE SCREENING  Lipid -  does not need  DM2 - does not need  Osteoporosis - women age 67+ - does not need  CANCER SCREENING  Cervical - needs  Breast - does not need  Lung - does not need  Colon - does not need  ADULT VACCINATION  Influenza - annual vaccine recommended  Td - booster every 10 years   Zoster - option at 81, yes at 60+   PCV13 - was not indicated  PPSV23 - was not indicated  There is no immunization history on file for this patient.   OTHER  Fall - exercise and Vit D age 67+ - does not need  Consider ASA - age 59-59 - does not need     Patient Instructions   Will have Pap results back within one week - let us know if you haven't heard back from [place]!   Recheck cholesterol labs and weight in 3 months - nurse visit and lab visit only     Visit summary with medication list and pertinent instructions was printed for patient to review. All questions at time of visit were answered -  patient instructed to contact office with any additional concerns. ER/RTC precautions were reviewed with the patient. Follow-up plan: Return in about 3 months (around 07/27/2017) for  nurse visit for weight recheck, lab visit to recheck cohlesterol.

## 2017-04-28 LAB — CYTOLOGY - PAP
Diagnosis: NEGATIVE
HPV: NOT DETECTED

## 2017-07-06 ENCOUNTER — Other Ambulatory Visit: Payer: Self-pay | Admitting: Osteopathic Medicine

## 2017-07-06 DIAGNOSIS — E669 Obesity, unspecified: Secondary | ICD-10-CM

## 2017-07-26 ENCOUNTER — Ambulatory Visit: Payer: BC Managed Care – PPO

## 2017-07-29 ENCOUNTER — Ambulatory Visit (INDEPENDENT_AMBULATORY_CARE_PROVIDER_SITE_OTHER): Payer: BC Managed Care – PPO | Admitting: Osteopathic Medicine

## 2017-07-29 DIAGNOSIS — E669 Obesity, unspecified: Secondary | ICD-10-CM

## 2017-07-29 NOTE — Progress Notes (Signed)
Pt in today for 3 month weight check. Pt is on Qysmia and has gained 1lb since last visit in July.

## 2017-07-30 MED ORDER — PHENTERMINE-TOPIRAMATE ER 15-92 MG PO CP24: 1.0000 | ORAL_CAPSULE | Freq: Every morning | ORAL | 2 refills | Status: DC

## 2017-07-30 NOTE — Progress Notes (Signed)
Can increase to maximum Qsymia and follow up with Dr A in 3 mos   BP (!) 105/54   Pulse 72   Ht 5\' 6"  (1.676 m)   Wt 191 lb (86.6 kg)   BMI 30.83 kg/m

## 2017-08-10 ENCOUNTER — Telehealth: Payer: Self-pay | Admitting: *Deleted

## 2017-08-10 NOTE — Telephone Encounter (Signed)
Pre Authorization sent to cover my meds. [id number]

## 2017-08-10 NOTE — Telephone Encounter (Signed)
Outcome  Approvedtoday  CaseId:[id number];Status:Approved;Review Type:Prior Auth;Coverage Start Date:07/11/2017;Coverage End Date:08/10/2018;  pharmacy and patient notified

## 2017-10-25 ENCOUNTER — Other Ambulatory Visit: Payer: Self-pay | Admitting: Osteopathic Medicine

## 2017-10-25 DIAGNOSIS — E282 Polycystic ovarian syndrome: Secondary | ICD-10-CM

## 2018-01-24 ENCOUNTER — Other Ambulatory Visit: Payer: Self-pay | Admitting: Osteopathic Medicine

## 2018-01-24 DIAGNOSIS — E282 Polycystic ovarian syndrome: Secondary | ICD-10-CM

## 2018-03-28 LAB — HM MAMMOGRAPHY

## 2018-04-20 ENCOUNTER — Encounter: Payer: Self-pay | Admitting: Osteopathic Medicine

## 2018-04-20 ENCOUNTER — Ambulatory Visit (INDEPENDENT_AMBULATORY_CARE_PROVIDER_SITE_OTHER): Payer: BC Managed Care – PPO | Admitting: Osteopathic Medicine

## 2018-04-20 ENCOUNTER — Other Ambulatory Visit: Payer: Self-pay | Admitting: Osteopathic Medicine

## 2018-04-20 VITALS — BP 119/72 | HR 67 | Temp 98.7°F | Wt 205.0 lb

## 2018-04-20 DIAGNOSIS — R635 Abnormal weight gain: Secondary | ICD-10-CM

## 2018-04-20 DIAGNOSIS — E669 Obesity, unspecified: Secondary | ICD-10-CM

## 2018-04-20 DIAGNOSIS — E282 Polycystic ovarian syndrome: Secondary | ICD-10-CM

## 2018-04-20 DIAGNOSIS — G5601 Carpal tunnel syndrome, right upper limb: Secondary | ICD-10-CM | POA: Diagnosis not present

## 2018-04-20 DIAGNOSIS — E7800 Pure hypercholesterolemia, unspecified: Secondary | ICD-10-CM

## 2018-04-20 MED ORDER — NORGESTIMATE-ETH ESTRADIOL 0.25-35 MG-MCG PO TABS: 1.0000 | ORAL_TABLET | Freq: Every day | ORAL | 3 refills | Status: DC

## 2018-04-20 NOTE — Patient Instructions (Addendum)
Weight loss: important things to remember  It is hard work! You will have setbacks, but don't get discouraged. The goal is not short-term success, it is long-term health.   Looking at the numbers is important to track your progress and set goals, but how you are feeling and your overall health are the most important things! BMI and pounds and calories and miles logged aren't everything - they are tools to help us reach your goals.  You can do this!!!   Things to remember for exercise for weight loss:   Please note - I am not a certified personal trainer. I can present you with ideas and general workout goals, but an exercise program is largely up to you. Find something you can stick with, and something you enjoy!   As you progress in your exercise regimen think about gradually increasing the following, week by week:   intensity (how strenuous is your workout)  frequency (how often you are exercising)  duration (how many minutes at a time you are exercising)  Walking for 20 minutes a day is certainly better than nothing, but more strenuous exercise will develop better cardiovascular fitness.   interval training (high-intensity alternating with low-intensity, think walk/jog rather than just walk)  muscle strengthening exercises (weight lifting, calisthenics, yoga) - this also helps prevent osteoporosis!   Things to remember for diet changes for weight loss:   Please note - I am not a certified dietician. I can present you with ideas and general diet goals, but a meal plan is largely up to you. I am happy to refer you to a dietician who can give you a detailed meal plan.  Apps/logs are crucial to track how you're eating! It's not realistic to be logging everything you eat forever, but when you're starting a healthy eating lifestyle it's very helpful, and checking in with logs now and then helps you stick to your program!   Calorie restriction with the goal weight loss of no more than one  to one and a half pounds per week.   Increase lean protein such as chicken, fish, [place]key.   Decrease fatty foods such as dairy, butter.   Decrease sugary foods. Avoid sugary drinks such as soda or juice.  Increase fiber found in fruit and vegetables.   Medications approved for long-term use for obesity  Qsymia (Phentermine and Topiramate) - you've been on this  Saxenda (Liraglutide 3 mg/day) - daily injectable   Contrave (Bupropion and Naltrexone)  Lorcaserin (Belviq or Belviq XR)  Orlistat (Xenical, Alli)  Bupropion (Wellbutrin)  I recommend that you research the above medications and see which one(s) your insurance may or may not cover: If you call your insurance, ask them specifically what medications are on their formulary that are approved for obesity treatment. They should be able to send you a list or tell you over the phone. Remember, medications aren't magic! You MUST be diligent about lifestyle changes as well!

## 2018-04-20 NOTE — Progress Notes (Signed)
HPI: Kathleen Ortega is a [age] y.o. female who  has a past medical history of IBS (irritable bowel syndrome), Irregular periods/menstrual cycles (10/10/2015), Obesity (01/31/2016), and Seasonal allergies.  she presents to [facility] today, 04/20/18,  for chief complaint of: Weight concerns, birth control pills, wrist pain   Birth control pills keep changing at the pharmacy -not sure if fluctuating hormone levels may have something to do with difficulty losing weight.  She has a history of PCOS.  Does not have any interest in this time at getting on any other form of hormonal management/contraception such as IUD, Depo-Provera, Nexplanon, NuvaRing.  Phentermine-Topiramate - has been off this, weight is worse. Keto diet helping a little. PCOS also not helpful for her weight loss.  She noticed some GERD symptoms on the phentermine-topiramate, inquires about other possible alternative medications . Wrist pain: Worse on the right, worse at night/morning.  Feels like it shooting up a bit into her forearm, points to the volar aspect of wrist     Past medical, surgical, social and family history reviewed:  Patient Active Problem List   Diagnosis Date Noted  . GERD (gastroesophageal reflux disease) 11/03/2016  . Obesity 01/31/2016  . Irregular periods/menstrual cycles 10/10/2015  . Medication management 10/10/2015  . Current tear of meniscus 05/24/2015  . History of cholecystectomy 01/13/2013  . Allergic rhinitis 10/04/2011  . Cephalalgia 10/04/2011  . Cannot sleep 10/04/2011    Past Surgical History:  Procedure Laterality Date  . CHOLECYSTECTOMY    . LASIK      Social History   Tobacco Use  . Smoking status: Never Smoker  . Smokeless tobacco: Never Used  Substance Use Topics  . Alcohol use: Yes    Family History  Problem Relation Age of Onset  . Hypertension Mother   . Hyperlipidemia Father   . Alcohol abuse Brother   . Diabetes Maternal  Grandmother   . Stroke Maternal Grandfather   . Cancer Paternal Grandmother        breast     Current medication list and allergy/intolerance information reviewed:    Current Outpatient Medications  Medication Sig Dispense Refill  . ALPRAZolam (XANAX) 0.5 MG tablet Take 0.5 mg by mouth as needed for anxiety.    . Benzoyl Peroxide 5 % lotion APPLY TOPICALLY TO AFFECTED AREA EVERY NIGHT, WASH OFF IN MORNING. NOTE - MAY BLEACH CLOTHING/LINENS 30 mL 0  . doxepin (SINEQUAN) 10 MG capsule Take 10 mg by mouth at bedtime.  5  . norgestimate-ethinyl estradiol (MONONESSA) 0.25-35 MG-MCG tablet Take 1 tablet by mouth daily. Pt needs f/u appt w/PCP for refills 84 tablet 0  . omeprazole (PRILOSEC) 40 MG capsule Take 40 mg by mouth 2 (two) times daily.  3  . Phentermine-Topiramate 15-92 MG CP24 Take 1 tablet by mouth every morning. 30 capsule 2   No current facility-administered medications for this visit.     Allergies  Allergen Reactions  . Codeine Hives and Swelling  . Erythromycin Hives and Swelling  . Penicillins Hives and Swelling  . Sulfa Antibiotics Hives and Swelling  . Sulfamethoxazole-Trimethoprim Hives and Swelling      Review of Systems:  Constitutional:  No  fever, no chills  Cardiac: No  chest pain, No  pressure  Musculoskeletal: +new myalgia/arthralgia  Skin: No  Rash  Neurologic: No  weakness, No  dizziness  Psychiatric: No  concerns with depression, No  concerns with anxiety  Exam:  BP 119/72 (BP Location: Left  Arm, Patient Position: Sitting, Cuff Size: Normal)   Pulse 67   Temp 98.7 F (37.1 C) (Oral)   Wt 205 lb (93 kg)   BMI 33.09 kg/m   Constitutional: VS see above. General Appearance: alert, well-developed, well-nourished, NAD  Eyes: Normal lids and conjunctive, non-icteric sclera  Ears, Nose, Mouth, Throat: MMM, Normal external inspection ears/nares/mouth/lips/gums.   Neck: No masses, trachea midline.   Respiratory: Normal respiratory effort. no  wheeze, no rhonchi, no rales  Cardiovascular: S1/S2 normal, no murmur, no rub/gallop auscultated. RRR. No lower extremity edema  Musculoskeletal: Gait normal. No clubbing/cyanosis of digits.  Positive Tinel sign over median nerve on right wrist  Neurological: Normal balance/coordination. No tremor.   Skin: warm, dry, intact. No rash/ulcer.   Psychiatric: Normal judgment/insight. Normal mood and affect. Oriented x3.      ASSESSMENT/PLAN:   Carpal tunnel syndrome on right - OMT applied to stretch retinaculum, wrist brace provided.  Advised follow-up with sports medicine to consider injection if no improvement  Class 1 obesity without serious comorbidity in adult, unspecified BMI, unspecified obesity type - Plan: CBC, COMPLETE METABOLIC PANEL WITH GFR, Lipid panel, TSH  PCOS (polycystic ovarian syndrome) - Plan: CBC, COMPLETE METABOLIC PANEL WITH GFR, Lipid panel, TSH, norgestimate-ethinyl estradiol (MONONESSA) 0.25-35 MG-MCG tablet  Elevated LDL cholesterol level - Plan: CBC, COMPLETE METABOLIC PANEL WITH GFR, Lipid panel, TSH  Weight gain - Plan: CBC, COMPLETE METABOLIC PANEL WITH GFR, Lipid panel, TSH    Patient Instructions  Weight loss: important things to remember  It is hard work! You will have setbacks, but don't get discouraged. The goal is not short-term success, it is long-term health.   Looking at the numbers is important to track your progress and set goals, but how you are feeling and your overall health are the most important things! BMI and pounds and calories and miles logged aren't everything - they are tools to help [place] reach your goals.  You can do this!!!   Things to remember for exercise for weight loss:   Please note - I am not a certified personal trainer. I can present you with ideas and general workout goals, but an exercise program is largely up to you. Find something you can stick with, and something you enjoy!   As you progress in your exercise  regimen think about gradually increasing the following, week by week:   intensity (how strenuous is your workout)  frequency (how often you are exercising)  duration (how many minutes at a time you are exercising)  Walking for 20 minutes a day is certainly better than nothing, but more strenuous exercise will develop better cardiovascular fitness.   interval training (high-intensity alternating with low-intensity, think walk/jog rather than just walk)  muscle strengthening exercises (weight lifting, calisthenics, yoga) - this also helps prevent osteoporosis!   Things to remember for diet changes for weight loss:   Please note - I am not a certified dietician. I can present you with ideas and general diet goals, but a meal plan is largely up to you. I am happy to refer you to a dietician who can give you a detailed meal plan.  Apps/logs are crucial to track how you're eating! It's not realistic to be logging everything you eat forever, but when you're starting a healthy eating lifestyle it's very helpful, and checking in with logs now and then helps you stick to your program!   Calorie restriction with the goal weight loss of no more than one to  one and a half pounds per week.   Increase lean protein such as chicken, fish, [place].   Decrease fatty foods such as dairy, butter.   Decrease sugary foods. Avoid sugary drinks such as soda or juice.  Increase fiber found in fruit and vegetables.   Medications approved for long-term use for obesity  Qsymia (Phentermine and Topiramate) - you've been on this  Saxenda (Liraglutide 3 mg/day) - daily injectable   Contrave (Bupropion and Naltrexone)  Lorcaserin (Belviq or Belviq XR)  Orlistat (Xenical, Alli)  Bupropion (Wellbutrin)  I recommend that you research the above medications and see which one(s) your insurance may or may not cover: If you call your insurance, ask them specifically what medications are on their formulary that are  approved for obesity treatment. They should be able to send you a list or tell you over the phone. Remember, medications aren't magic! You MUST be diligent about lifestyle changes as well!            Visit summary with medication list and pertinent instructions was printed for patient to review. All questions at time of visit were answered - patient instructed to contact office with any additional concerns. ER/RTC precautions were reviewed with the patient.   Follow-up plan: Return in about 3 months (around 07/21/2018) for recheck weight, annual physical, sooner if needed . Consider sports med visit for carpal tunnel .  Note: Total time spent 25 minutes, greater than 50% of the visit was spent face-to-face counseling and coordinating care for the following: The primary encounter diagnosis was Carpal tunnel syndrome on right. Diagnoses of Class 1 obesity without serious comorbidity in adult, unspecified BMI, unspecified obesity type, PCOS (polycystic ovarian syndrome), Elevated LDL cholesterol level, and Weight gain were also pertinent to this visit.Marland Kitchen  Please note: voice recognition software was used to produce this document, and typos may escape review. Please contact Dr. Lyn Hollingshead for any needed clarifications.

## 2018-04-21 LAB — LIPID PANEL
CHOL/HDL RATIO: 3.5 (calc) (ref <5.0)
Cholesterol: 248 mg/dL — ABNORMAL HIGH (ref <200)
HDL: 70 mg/dL (ref >50)
LDL CHOLESTEROL (CALC): 158 mg/dL — AB
NON-HDL CHOLESTEROL (CALC): 178 mg/dL — AB (ref <130)
TRIGLYCERIDES: 95 mg/dL (ref <150)

## 2018-04-21 LAB — COMPLETE METABOLIC PANEL WITH GFR
AG RATIO: 1.3 (calc) (ref 1.0–2.5)
ALT: 20 U/L (ref 6–29)
AST: 18 U/L (ref 10–30)
Albumin: 3.9 g/dL (ref 3.6–5.1)
Alkaline phosphatase (APISO): 71 U/L (ref 33–115)
BILIRUBIN TOTAL: 0.4 mg/dL (ref 0.2–1.2)
BUN: 9 mg/dL (ref 7–25)
CHLORIDE: 104 mmol/L (ref 98–110)
CO2: 26 mmol/L (ref 20–32)
Calcium: 9.2 mg/dL (ref 8.6–10.2)
Creat: 0.69 mg/dL (ref 0.50–1.10)
GFR, Est African American: 124 mL/min/{1.73_m2} (ref >=60)
GFR, Est Non African American: 107 mL/min/{1.73_m2} (ref >=60)
Globulin: 2.9 g/dL (calc) (ref 1.9–3.7)
Glucose, Bld: 87 mg/dL (ref 65–139)
POTASSIUM: 4.4 mmol/L (ref 3.5–5.3)
Sodium: 137 mmol/L (ref 135–146)
Total Protein: 6.8 g/dL (ref 6.1–8.1)

## 2018-04-21 LAB — CBC
HCT: 41.4 % (ref 35.0–45.0)
Hemoglobin: 13.9 g/dL (ref 11.7–15.5)
MCH: 30.2 pg (ref 27.0–33.0)
MCHC: 33.6 g/dL (ref 32.0–36.0)
MCV: 90 fL (ref 80.0–100.0)
MPV: 9.8 fL (ref 7.5–12.5)
PLATELETS: 325 10*3/uL (ref 140–400)
RBC: 4.6 10*6/uL (ref 3.80–5.10)
RDW: 12.1 % (ref 11.0–15.0)
WBC: 7.7 10*3/uL (ref 3.8–10.8)

## 2018-04-21 LAB — TSH: TSH: 1.76 mIU/L

## 2018-04-28 ENCOUNTER — Telehealth: Payer: Self-pay

## 2018-04-28 ENCOUNTER — Encounter: Payer: Self-pay | Admitting: Osteopathic Medicine

## 2018-04-28 ENCOUNTER — Other Ambulatory Visit: Payer: Self-pay | Admitting: Osteopathic Medicine

## 2018-04-28 MED ORDER — PHENTERMINE-TOPIRAMATE ER 15-92 MG PO CP24: 1.0000 | ORAL_CAPSULE | Freq: Every morning | ORAL | 0 refills | Status: DC

## 2018-04-28 NOTE — Telephone Encounter (Addendum)
Pt called requesting an update on on coverage for Qsymia med. Pt is upset because she will be leaving on late tomorrow afternoon for vacation. She hasn't had any information whether rx has been issue by covering provider or if rx is ready to be pick up at pharmacy. Pt is requesting a call back today. She can be reached at [phone]. Thanks.

## 2018-04-28 NOTE — Telephone Encounter (Signed)
Spoke with patient and she was aware it has been sent to the pharmacy. Form will be sent to scan since its approved. It has been approved from 04/28/2018 through 04/29/2019.-HSM

## 2018-04-28 NOTE — Telephone Encounter (Signed)
Called the patient and advised her that all the information has been sent to the insurance and we are waiting on a response. Patient voices understanding.

## 2018-04-28 NOTE — Telephone Encounter (Signed)
I electronically prescribed this medication to [facility].

## 2018-05-09 ENCOUNTER — Encounter: Payer: Self-pay | Admitting: Osteopathic Medicine

## 2018-05-09 ENCOUNTER — Ambulatory Visit (INDEPENDENT_AMBULATORY_CARE_PROVIDER_SITE_OTHER): Payer: BC Managed Care – PPO | Admitting: Osteopathic Medicine

## 2018-05-09 VITALS — BP 117/74 | HR 72 | Temp 98.3°F | Wt 197.5 lb

## 2018-05-09 DIAGNOSIS — I8392 Asymptomatic varicose veins of left lower extremity: Secondary | ICD-10-CM

## 2018-05-09 NOTE — Progress Notes (Signed)
HPI: Kathleen SongsterFarrah H Ortega is a 42 y.o. female who  has a past medical history of IBS (irritable bowel syndrome), Irregular periods/menstrual cycles (10/10/2015), Obesity (01/31/2016), and Seasonal allergies.  she presents to [facility] today, 05/09/18,  for chief complaint of:  Leg/skin problem   L leg by the knee, bump. Noticed a few mos ago, thought it was a bruise. Mom had a ruptured vein in the past so she's concerned about this happening to her. Non painful. No bleeding/ecchymoses.     Past medical history, surgical history, and family history reviewed.  Current medication list and allergy/intolerance information reviewed.   (See remainder of HPI, ROS, Phys Exam below)    ASSESSMENT/PLAN: The encounter diagnosis was Asymptomatic varicose veins of left lower extremity.   Sounds like her mom had a pretty unusual case, precautions were reviewed in case something similar happens, or if firmness/redness or pain develops concern for possible superficial clot which we can typically treat with NSAIDs and warm compresses.  Let me know if it changes at all in character or if she would like to see a specialist about cosmetic/medical treatment   Follow-up plan: Keep currently scheduled follow-up for weight check in a few months               ############################################ ############################################ ############################################ ############################################    Outpatient Encounter Medications as of 05/09/2018  Medication Sig  . ALPRAZolam (XANAX) 0.5 MG tablet Take 0.5 mg by mouth as needed for anxiety.  . Benzoyl Peroxide 5 % lotion APPLY TOPICALLY TO AFFECTED AREA EVERY NIGHT, WASH OFF IN MORNING. NOTE - MAY BLEACH CLOTHING/LINENS  . doxepin (SINEQUAN) 10 MG capsule Take 10 mg by mouth at bedtime.  . norgestimate-ethinyl estradiol (MONONESSA) 0.25-35 MG-MCG tablet Take 1 tablet by  mouth daily.  Marland Kitchen. omeprazole (PRILOSEC) 40 MG capsule Take 40 mg by mouth 2 (two) times daily.  . Phentermine-Topiramate (QSYMIA) 15-92 MG CP24 Take 1 capsule by mouth every morning.   No facility-administered encounter medications on file as of 05/09/2018.    Allergies  Allergen Reactions  . Codeine Hives, Swelling, Anaphylaxis and Other (See Comments)  . Erythromycin Base Anaphylaxis  . Moxifloxacin Other (See Comments)    Other Reaction: Other reaction  . Penicillins Hives, Swelling and Anaphylaxis  . Sulfa Antibiotics Hives, Swelling and Other (See Comments)    Other Reaction: Other reaction   . Erythromycin Hives and Swelling  . Sulfamethoxazole-Trimethoprim Hives and Swelling      Review of Systems:  Constitutional: No recent illness  Cardiac: No  chest pain, No  pressure, No palpitations  Respiratory:  No  shortness of breath. No  Cough  Musculoskeletal: No new myalgia/arthralgia  Hem/Onc: No  easy bruising/bleeding, +abnormal lumps/bumps  Neurologic: No  weakness, No  Dizziness   Exam:  BP 117/74 (BP Location: Left Arm, Patient Position: Sitting, Cuff Size: Normal)   Pulse 72   Temp 98.3 F (36.8 C) (Oral)   Wt 197 lb 8 oz (89.6 kg)   BMI 31.88 kg/m   Constitutional: VS see above. General Appearance: alert, well-developed, well-nourished, NAD  Eyes: Normal lids and conjunctive, non-icteric sclera  Ears, Nose, Mouth, Throat: MMM, Normal external inspection ears/nares/mouth/lips/gums.  Neck: No masses, trachea midline.   Respiratory: Normal respiratory effort.  Musculoskeletal: Gait normal. Symmetric and independent movement of all extremities. No LE edema.   Neurological: Normal balance/coordination. No tremor.  Skin: warm, dry, intact.  Varicose vein left leg medial, mild, nontender, healthy skin overlying.  Psychiatric: Normal judgment/insight. Normal mood and affect. Oriented x3.   Visit summary with medication list and pertinent instructions was  printed for patient to review, advised to alert [place] if any changes needed. All questions at time of visit were answered - patient instructed to contact office with any additional concerns. ER/RTC precautions were reviewed with the patient and understanding verbalized.     Please note: voice recognition software was used to produce this document, and typos may escape review. Please contact Dr. Lyn Hollingshead for any needed clarifications.

## 2018-05-09 NOTE — Progress Notes (Signed)
117/74  

## 2018-05-20 ENCOUNTER — Encounter: Payer: Self-pay | Admitting: Osteopathic Medicine

## 2018-05-23 ENCOUNTER — Ambulatory Visit: Payer: BC Managed Care – PPO | Admitting: Family Medicine

## 2018-05-25 ENCOUNTER — Encounter: Payer: Self-pay | Admitting: Osteopathic Medicine

## 2018-05-25 ENCOUNTER — Ambulatory Visit (INDEPENDENT_AMBULATORY_CARE_PROVIDER_SITE_OTHER): Payer: BC Managed Care – PPO | Admitting: Osteopathic Medicine

## 2018-05-25 VITALS — BP 111/73 | HR 89 | Temp 98.2°F | Ht 66.0 in | Wt 192.0 lb

## 2018-05-25 DIAGNOSIS — N920 Excessive and frequent menstruation with regular cycle: Secondary | ICD-10-CM

## 2018-05-25 NOTE — Progress Notes (Signed)
HPI: Kathleen Ortega is a 42 y.o. female who  has a past medical history of IBS (irritable bowel syndrome), Irregular periods/menstrual cycles (10/10/2015), Obesity (01/31/2016), and Seasonal allergies.  Kathleen Ortega presents to [facility] today, 05/25/18,  for chief complaint of:  Spotting on birth control   Spotting on new birth control. Not going on since this Sunday. Tried to call and cancel her appt. yesterday but was told Kathleen Ortega had to come in today.     Past medical history, surgical history, and family history reviewed.  Current medication list and allergy/intolerance information reviewed.   (See remainder of HPI, ROS, Phys Exam below)    ASSESSMENT/PLAN:   Spotting   Symptoms resolved, pt tried to cancel appointment, will not charge for this visit     Follow-up plan: Return for recheck if needed .     ############################################ ############################################ ############################################ ############################################    Outpatient Encounter Medications as of 05/25/2018  Medication Sig  . ALPRAZolam (XANAX) 0.5 MG tablet Take 0.5 mg by mouth as needed for anxiety.  . Benzoyl Peroxide 5 % lotion APPLY TOPICALLY TO AFFECTED AREA EVERY NIGHT, WASH OFF IN MORNING. NOTE - MAY BLEACH CLOTHING/LINENS  . doxepin (SINEQUAN) 10 MG capsule Take 10 mg by mouth at bedtime.  . norgestimate-ethinyl estradiol (MONONESSA) 0.25-35 MG-MCG tablet Take 1 tablet by mouth daily.  Marland Kitchen. omeprazole (PRILOSEC) 40 MG capsule Take 40 mg by mouth 2 (two) times daily.  . Phentermine-Topiramate (QSYMIA) 15-92 MG CP24 Take 1 capsule by mouth every morning.   No facility-administered encounter medications on file as of 05/25/2018.    Allergies  Allergen Reactions  . Codeine Hives, Swelling, Anaphylaxis and Other (See Comments)  . Erythromycin Base Anaphylaxis  . Moxifloxacin Other (See Comments)    Other  Reaction: Other reaction  . Penicillins Hives, Swelling and Anaphylaxis  . Sulfa Antibiotics Hives, Swelling and Other (See Comments)    Other Reaction: Other reaction   . Erythromycin Hives and Swelling  . Sulfamethoxazole-Trimethoprim Hives and Swelling      Review of Systems:  Constitutional: No recent illness, no fever or chills  GU: no spotting at this time, no pelvic pain   Exam:  BP 111/73   Pulse 89   Temp 98.2 F (36.8 C) (Oral)   Ht 5\' 6"  (1.676 m)   Wt 192 lb (87.1 kg)   BMI 30.99 kg/m   Constitutional: VS see above. General Appearance: alert, well-developed, well-nourished, NAD  Psychiatric: Normal judgment/insight. Normal mood and affect. Oriented x3.   Visit summary with medication list and pertinent instructions was printed for patient to review, advised to alert us if any changes needed. All questions at time of visit were answered - patient instructed to contact office with any additional concerns. ER/RTC precautions were reviewed with the patient and understanding verbalized.   Follow-up plan: Return for recheck if needed .    Please note: voice recognition software was used to produce this document, and typos may escape review. Please contact Dr. Lyn HollingsheadAlexander for any needed clarifications.

## 2018-06-05 ENCOUNTER — Other Ambulatory Visit: Payer: Self-pay | Admitting: Family Medicine

## 2018-06-07 ENCOUNTER — Other Ambulatory Visit: Payer: Self-pay | Admitting: Family Medicine

## 2018-06-07 NOTE — Telephone Encounter (Signed)
RF request to PCP

## 2018-06-08 ENCOUNTER — Encounter: Payer: Self-pay | Admitting: Osteopathic Medicine

## 2018-06-08 NOTE — Telephone Encounter (Signed)
Fwd request to PCP

## 2018-06-22 DIAGNOSIS — F32A Depression, unspecified: Secondary | ICD-10-CM | POA: Insufficient documentation

## 2018-06-22 DIAGNOSIS — F329 Major depressive disorder, single episode, unspecified: Secondary | ICD-10-CM | POA: Insufficient documentation

## 2018-07-06 ENCOUNTER — Other Ambulatory Visit: Payer: Self-pay | Admitting: Osteopathic Medicine

## 2018-07-07 MED ORDER — PHENTERMINE-TOPIRAMATE ER 15-92 MG PO CP24: 1.0000 | ORAL_CAPSULE | Freq: Every morning | ORAL | 0 refills | Status: DC

## 2018-07-07 NOTE — Telephone Encounter (Signed)
[facility] pharmacy requesting med RF for Qysmia.

## 2018-07-08 NOTE — Telephone Encounter (Signed)
Pt has been updated.  

## 2018-07-18 ENCOUNTER — Encounter: Payer: Self-pay | Admitting: Osteopathic Medicine

## 2018-07-18 ENCOUNTER — Ambulatory Visit (INDEPENDENT_AMBULATORY_CARE_PROVIDER_SITE_OTHER): Payer: BC Managed Care – PPO | Admitting: Osteopathic Medicine

## 2018-07-18 VITALS — BP 127/63 | HR 90 | Temp 98.1°F | Wt 187.1 lb

## 2018-07-18 DIAGNOSIS — R635 Abnormal weight gain: Secondary | ICD-10-CM

## 2018-07-18 DIAGNOSIS — M25552 Pain in left hip: Secondary | ICD-10-CM

## 2018-07-18 MED ORDER — PHENTERMINE-TOPIRAMATE ER 15-92 MG PO CP24: 1.0000 | ORAL_CAPSULE | Freq: Every morning | ORAL | 3 refills | Status: DC

## 2018-07-18 NOTE — Progress Notes (Signed)
HPI: Kathleen Ortega is a 42 y.o. female who  has a past medical history of IBS (irritable bowel syndrome), Irregular periods/menstrual cycles (10/10/2015), Obesity (01/31/2016), and Seasonal allergies.  she presents to [facility] today, 07/18/18,  for chief complaint of:  F/u weight gain  Left hip pain  Doing well on Qsymia.  Has made small consistent changes with diet/exercise.  Wt Readings from Last 3 Encounters:  07/18/18 187 lb 1.6 oz (84.9 kg)  05/25/18 192 lb (87.1 kg)  05/09/18 197 lb 8 oz (89.6 kg)    Left hip pain  . Location: Left hip/groin . Quality: Sore/stretching . Duration: Couple of months, worse past week or 2 . Context: No injury . Modifying factors: Hurts with hip flexion and with walking long distances        Past medical history, surgical history, and family history reviewed.  Current medication list and allergy/intolerance information reviewed.   (See remainder of HPI, ROS, Phys Exam below)    ASSESSMENT/PLAN:   Abnormal weight gain - Steady weight loss on phentermine-topiramate, discussed long-term use of this medication is okay to continue  Left hip pain - Printed home exercises for hip flexor strain, consider physical therapy/sports medicine referral - Plan: DG HIP UNILAT W OR W/O PELVIS 2-3 VIEWS LEFT   Meds ordered this encounter  Medications  . Phentermine-Topiramate (QSYMIA) 15-92 MG CP24    Sig: Take 1 capsule by mouth every morning.    Dispense:  90 capsule    Refill:  3    Follow-up plan: Return for Annual physical 04/2019 when due, see me sooner if needed .            ############################################ ############################################ ############################################ ############################################    Outpatient Encounter Medications as of 07/18/2018  Medication Sig  . ALPRAZolam (XANAX) 0.5 MG tablet Take 0.5 mg by mouth as needed  for anxiety.  . Benzoyl Peroxide 5 % lotion APPLY TOPICALLY TO AFFECTED AREA EVERY NIGHT, WASH OFF IN MORNING. NOTE - MAY BLEACH CLOTHING/LINENS  . doxepin (SINEQUAN) 10 MG capsule Take 10 mg by mouth at bedtime.  . norgestimate-ethinyl estradiol (MONONESSA) 0.25-35 MG-MCG tablet Take 1 tablet by mouth daily.  Marland Kitchen omeprazole (PRILOSEC) 40 MG capsule Take 40 mg by mouth 2 (two) times daily.  . Phentermine-Topiramate (QSYMIA) 15-92 MG CP24 Take 1 capsule by mouth every morning.   No facility-administered encounter medications on file as of 07/18/2018.    Allergies  Allergen Reactions  . Codeine Hives, Swelling, Anaphylaxis and Other (See Comments)  . Erythromycin Base Anaphylaxis  . Moxifloxacin Other (See Comments)    Other Reaction: Other reaction  . Penicillins Hives, Swelling and Anaphylaxis  . Sulfa Antibiotics Hives, Swelling and Other (See Comments)    Other Reaction: Other reaction   . Erythromycin Hives and Swelling  . Sulfamethoxazole-Trimethoprim Hives and Swelling      Review of Systems:  Constitutional: No recent illness  HEENT: No  headache, no vision change  Cardiac: No  chest pain, No  pressure, No palpitations  Gastrointestinal: No  abdominal pain, no change on bowel habits  Musculoskeletal: +myalgia/arthralgia  Neurologic: No  weakness, No  Dizziness  Psychiatric: No  concerns with depression, No  concerns with anxiety  Exam:  BP 127/63 (BP Location: Left Arm, Patient Position: Sitting, Cuff Size: Normal)   Pulse 90   Temp 98.1 F (36.7 C) (Oral)   Wt 187 lb 1.6 oz (84.9 kg)   BMI 30.20 kg/m   Constitutional:  VS see above. General Appearance: alert, well-developed, well-nourished, NAD  Eyes: Normal lids and conjunctive, non-icteric sclera  Ears, Nose, Mouth, Throat: MMM, Normal external inspection ears/nares/mouth/lips/gums.  Neck: No masses, trachea midline.   Respiratory: Normal respiratory effort. no wheeze, no rhonchi, no  rales  Cardiovascular: S1/S2 normal, no murmur, no rub/gallop auscultated. RRR.   Musculoskeletal: Gait normal. Symmetric and independent movement of all extremities. (+)L hip pain w/ FABER and resisted flexion  Neurological: Normal balance/coordination. No tremor.  Skin: warm, dry, intact.   Psychiatric: Normal judgment/insight. Normal mood and affect. Oriented x3.   Visit summary with medication list and pertinent instructions was printed for patient to review, advised to alert [place] if any changes needed. All questions at time of visit were answered - patient instructed to contact office with any additional concerns. ER/RTC precautions were reviewed with the patient and understanding verbalized.   Follow-up plan: Return for Annual physical 04/2019 when due, see me sooner if needed .  Sports medicine follow-up for hip pain if needed    Please note: voice recognition software was used to produce this document, and typos may escape review. Please contact Dr. Lyn Hollingshead for any needed clarifications.

## 2018-07-21 ENCOUNTER — Ambulatory Visit: Payer: BC Managed Care – PPO | Admitting: Osteopathic Medicine

## 2018-09-24 ENCOUNTER — Other Ambulatory Visit: Payer: Self-pay | Admitting: Sports Medicine

## 2018-09-26 NOTE — Telephone Encounter (Signed)
To PCP

## 2018-11-09 ENCOUNTER — Encounter: Payer: Self-pay | Admitting: Osteopathic Medicine

## 2018-11-10 ENCOUNTER — Ambulatory Visit (INDEPENDENT_AMBULATORY_CARE_PROVIDER_SITE_OTHER): Payer: BC Managed Care – PPO | Admitting: Family Medicine

## 2018-11-10 ENCOUNTER — Encounter: Payer: Self-pay | Admitting: Family Medicine

## 2018-11-10 ENCOUNTER — Ambulatory Visit (INDEPENDENT_AMBULATORY_CARE_PROVIDER_SITE_OTHER): Payer: BC Managed Care – PPO

## 2018-11-10 VITALS — BP 111/57 | HR 93 | Wt 187.0 lb

## 2018-11-10 DIAGNOSIS — M7062 Trochanteric bursitis, left hip: Secondary | ICD-10-CM

## 2018-11-10 DIAGNOSIS — K2900 Acute gastritis without bleeding: Secondary | ICD-10-CM | POA: Diagnosis not present

## 2018-11-10 DIAGNOSIS — M25551 Pain in right hip: Secondary | ICD-10-CM

## 2018-11-10 DIAGNOSIS — M25552 Pain in left hip: Secondary | ICD-10-CM

## 2018-11-10 MED ORDER — SUCRALFATE 1 G PO TABS: 1.0000 g | ORAL_TABLET | Freq: Four times a day (QID) | ORAL | 0 refills | Status: DC

## 2018-11-10 MED ORDER — CELECOXIB 200 MG PO CAPS: ORAL_CAPSULE | ORAL | 2 refills | Status: DC

## 2018-11-10 NOTE — Patient Instructions (Addendum)
Thank you for coming in today. Take celebrex for pain instead of ibuprofen.  Use carafate for 1 week to line the stomach.  Take omeprazole daily.     Gastritis, Adult Gastritis is inflammation of the stomach. There are two kinds of gastritis:  Acute gastritis. This kind develops suddenly.  Chronic gastritis. This kind is much more common and lasts for a long time. Gastritis happens when the lining of the stomach becomes weak or gets damaged. Without treatment, gastritis can lead to stomach bleeding and ulcers. What are the causes? This condition may be caused by:  An infection.  Drinking too much alcohol.  Certain medicines. These include steroids, antibiotics, and some over-the-counter medicines, such as aspirin or ibuprofen.  Having too much acid in the stomach.  A disease of the intestines or stomach.  Stress.  An allergic reaction.  Crohn's disease.  Some cancer treatments (radiation). Sometimes the cause of this condition is not known. What are the signs or symptoms? Symptoms of this condition include:  Pain or a burning sensation in the upper abdomen.  Nausea.  Vomiting.  An uncomfortable feeling of fullness after eating.  Weight loss.  Bad breath.  Blood in your vomit or stools. In some cases, there are no symptoms. How is this diagnosed? This condition may be diagnosed with:  Your medical history and a description of your symptoms.  A physical exam.  Tests. These can include: ? Blood tests. ? Stool tests. ? A test in which a thin, flexible instrument with a light and a camera is passed down the esophagus and into the stomach (upper endoscopy). ? A test in which a sample of tissue is taken for testing (biopsy). How is this treated? This condition may be treated with medicines. The medicines that are used vary depending on the cause of the gastritis:  If the condition is caused by a bacterial infection, you may be given antibiotic  medicines.  If the condition is caused by too much acid in the stomach, you may be given medicines called H2 blockers, proton pump inhibitors, or antacids. Treatment may also involve stopping the use of certain medicines, such as aspirin, ibuprofen, or other NSAIDs. Follow these instructions at home: Medicines  Take over-the-counter and prescription medicines only as told by your health care provider.  If you were prescribed an antibiotic medicine, take it as told by your health care provider. Do not stop taking the antibiotic even if you start to feel better. Eating and drinking   Eat small, frequent meals instead of large meals.  Avoid foods and drinks that make your symptoms worse.  Drink enough fluid to keep your urine pale yellow. Alcohol use  Do not drink alcohol if: ? Your health care provider tells you not to drink. ? You are pregnant, may be pregnant, or are planning to become pregnant.  If you drink alcohol: ? Limit your use to:  0-1 drink a day for women.  0-2 drinks a day for men. ? Be aware of how much alcohol is in your drink. In the [place] one drink equals one 12 oz bottle of beer (355 mL), one 5 oz glass of wine (148 mL), or one 1 oz glass of hard liquor (44 mL). General instructions  Talk with your health care provider about ways to manage stress, such as getting regular exercise or practicing deep breathing, meditation, or yoga.  Do not use any products that contain nicotine or tobacco, such as cigarettes and e-cigarettes. If you  need help quitting, ask your health care provider.  Keep all follow-up visits as told by your health care provider. This is important. Contact a health care provider if:  Your symptoms get worse.  Your symptoms return after treatment. Get help right away if:  You vomit blood or material that looks like coffee grounds.  You have black or dark red stools.  You are unable to keep fluids down.  Your abdominal pain gets  worse.  You have a fever.  You do not feel better after one week. Summary  Gastritis is inflammation of the lining of the stomach that can occur suddenly (acute) or develop slowly over time (chronic).  This condition is diagnosed with a medical history, a physical exam, or tests.  This condition may be treated with medicines to treat infection or medicines to reduce the amount of acid in your stomach.  Follow your health care provider's instructions about taking medicines, making changes to your diet, and knowing when to call for help. This information is not intended to replace advice given to you by your health care provider. Make sure you discuss any questions you have with your health care provider. Document Released: 09/15/2001 Document Revised: 02/08/2018 Document Reviewed: 02/08/2018 Elsevier Interactive Patient Education  2019 [employer]

## 2018-11-10 NOTE — Progress Notes (Signed)
Subjective:    I'm seeing this patient as a consultation for:  Kathleen Nielsen, DO   CC: hip pain  HPI: Kathleen Ortega is a 43 y.o. female who presents to [facility] today for bilateral hip pain, back pain, and epigastric pain.  Bilateral hip pain: Kathleen Ortega has had bilateral anterior hip pain since August 2019. She has been doing hip flexor stretches provided to her by Dr. Lyn Hollingshead, but these have not helped her symptoms. Pain is worse when sitting for a long time, when sleeping, or when doing the elliptical at the gym. No pain with standing, walking, or taking the stairs. She occasionally takes ibuprofen for pain.  Low back pain: Kathleen Ortega has had pain directly over her sacrum for a several years, but the pain has worsened over the past 3-4 years. This pain is worse when she is sitting or sleeping. Denies numbness, tingling, or burning down the back of her leg. She does have a history of sciatica with both of her prior pregnancies.   Epigastric pain: Kathleen Ortega had sudden onset epigastric pain on Monday night, and the pain has continued off and on since then. It is a stabbing pain. Worse after eating. Occasional nausea. Denies vomiting, blood in stool, change in diet, or a lot of NSAID use. She has GERD and takes omeprazole daily.   Past medical history, Surgical history, Family history not pertinant except as noted below, Social history, Allergies, and medications have been entered into the medical record, reviewed, and no changes needed.   Review of Systems: No headache, visual changes, nausea, vomiting, diarrhea, constipation, dizziness, abdominal pain, skin rash, fevers, chills, night sweats, weight loss, swollen lymph nodes, body aches, joint swelling, muscle aches, chest pain, shortness of breath, mood changes, visual or auditory hallucinations.   Objective:    Vitals:   11/10/18 1256  BP: (!) 111/57  Pulse: 93   General: Well  Developed, well nourished, and in no acute distress.  Neuro/Psych: Alert and oriented x3, extra-ocular muscles intact, able to move all 4 extremities, sensation grossly intact. Skin: Warm and dry, no rashes noted.  Respiratory: Not using accessory muscles, speaking in full sentences, trachea midline.  Clear to auscultation bilaterally Cardiovascular: Pulses palpable, no extremity edema.  Regular rate and rhythm no murmurs rubs or gallops Abdomen: Does not appear distended.  Normal auscultation.  Mildly tender to palpation upper left quadrant.  No masses rebound or guarding. MSK: Left hip:  Normal appearance. Tender to palpation over greater trochanter Normal ROM. pain with internal rotation.  Strength 4/5 and pain with resisted abduction.  Strength 5/5 with resisted flexion and adduction. Negative log roll test.  Positive Faber test. Pulses and capillary refill normal.   Right hip: Normal appearance. No tenderness to palpation.  Normal ROM.  Strength 4/5 without pain with resisted abduction.  Strength 5/5 with resisted flexion and adduction.  Negative log roll test.  Negative Faber test.  Pulses and capillary refill normal.   LS-spine: Normal appearence. Mildly tender to palpation over sacrum.  Full ROM without pain.  Pulses and capillary refill normal.   Lab and Radiology Results X-ray images personally independently reviewed. Hips bilaterally without significant DJD or degenerative changes.  Possible cam type appearance of femoral head bilaterally however mild. No acute fracture. Await formal radiology review  Impression and Recommendations:    Assessment and Plan: 43 y.o. female with Bilateral hip pain and low back pain: Kathleen Ortega has had bilateral hip  pain since August. Home hip flexor exercises have not relieved the pain. Hip x-ray did not reveal any major DJD. She could possibly have labrum tear or femoral acetabular impingement. Physical exam findings make it likely  that she has greater trochanteric bursitis and hip impingement on the left. Attend PT now. If not improved in 6 weeks, plan to do MR arthrogram. Can take Celebrex for pain due to concern for PUD as below. Do not take ibuprofen.    Epigastric pain: Kathleen Ortega started having intermittent stabbing epigastric pain on Monday that worsens after eating. Concerned for PUD, but cannot do H pylori breath test as she takes daily omeprazole. Continue daily omeprazole and use carafate for one week to line the stomach. Follow-up as needed. .   Orders Placed This Encounter  Procedures  . DG HIPS BILAT WITH PELVIS 3-4 VIEWS    Standing Status:   Future    Number of Occurrences:   1    Standing Expiration Date:   01/09/2020    Order Specific Question:   Reason for Exam (SYMPTOM  OR DIAGNOSIS REQUIRED)    Answer:   bilateral hip pain    Order Specific Question:   Is patient pregnant?    Answer:   No    Order Specific Question:   Preferred imaging location?    [facility]    Order Specific Question:   Radiology Contrast Protocol - do NOT remove file path    Answer:   [email or web address]  . Ambulatory referral to Physical Therapy    Referral Priority:   Routine    Referral Type:   Physical Medicine    Referral Reason:   Specialty Services Required    Requested Specialty:   Physical Therapy    Number of Visits Requested:   1   Meds ordered this encounter  Medications  . celecoxib (CELEBREX) 200 MG capsule    Sig: One to 2 tablets by mouth daily as needed for pain.    Dispense:  60 capsule    Refill:  2  . sucralfate (CARAFATE) 1 g tablet    Sig: Take 1 tablet (1 g total) by mouth 4 (four) times daily.    Dispense:  120 tablet    Refill:  0    Discussed warning signs or symptoms. Please see discharge instructions. Patient expresses understanding.  I personally was present and performed or re-performed the history, physical exam and medical  decision-making activities of this service and have verified that the service and findings are accurately documented in the student's note. ___________________________________________ Clementeen GrahamEvan Haywood Meinders M.D., ABFM., CAQSM. Primary Care and Sports Medicine Adjunct Instructor of Family Medicine  [employer]

## 2018-12-13 ENCOUNTER — Other Ambulatory Visit: Payer: Self-pay | Admitting: Family Medicine

## 2018-12-23 ENCOUNTER — Ambulatory Visit (INDEPENDENT_AMBULATORY_CARE_PROVIDER_SITE_OTHER): Payer: BC Managed Care – PPO | Admitting: Family Medicine

## 2018-12-23 DIAGNOSIS — R1013 Epigastric pain: Secondary | ICD-10-CM | POA: Diagnosis not present

## 2018-12-23 DIAGNOSIS — M7062 Trochanteric bursitis, left hip: Secondary | ICD-10-CM

## 2018-12-23 NOTE — Progress Notes (Signed)
Virtual Visit  via Video or Phone Note  I connected with@ on 12/23/18 at 11:10 by a video enabled telemedicine application and verified that I am speaking with the correct person using two identifiers.   I discussed the limitations of evaluation and management by telemedicine and the availability of in person appointments. The patient expressed understanding and agreed to proceed.  History of Present Illness: Kathleen Ortega is a 43 y.o. female who would like to discuss her hip pain and follow-up her abdominal pain.  [name] was seen on February 6 for pain in her low back and buttocks thought to be trochanteric bursitis and possible hip impingement.  She is been attending physical therapy and notes moderate improvement in symptoms.  She does have some soreness.  She is not fully resolved but is improved.  Additionally she was seen for abdominal pain thought to be possible gastritis.  She was given Carafate and omeprazole.  She had follow-up with gastroenterology which was normal.  She has been using ginger pills and notes significant improvement in symptoms.  She is happy with how things are going.   Observations/Objective: Exam: Normal Speech.    Lab and Radiology Results Dg Hips Bilat With Pelvis 3-4 Views  Result Date: 11/10/2018 CLINICAL DATA:  Bilateral hip pain for several months, gradually worsening. EXAM: DG HIP (WITH OR WITHOUT PELVIS) 3-4V BILAT COMPARISON:  None. FINDINGS: Normal appearing hips, pelvic bones and lower lumbar spine. Bilateral pelvic phleboliths. IMPRESSION: Normal examination. Electronically Signed   By: Beckie Salts M.D.   On: 11/10/2018 21:59     Assessment and Plan: 43 y.o. female with follow-up hip pain.  Patient has had significant improvement with physical therapy but is still moderately symptomatic.  Unfortunately we are trying to limit office visits now unless absolutely necessary due to the COVID-19 pandemic.  Plan to proceed with home exercise  program and physical therapy as able for the next 6 weeks.  Return to clinic at that point for reassessment if still symptomatic.  May benefit from x-ray of lumbar spine or potentially MRI.  Dyspepsia: Considerable improvement with ginger pills.  Proceed with home remedy and watchful waiting.   Follow Up Instructions:    I discussed the assessment and treatment plan with the patient. The patient was provided an opportunity to ask questions and all were answered. The patient agreed with the plan and demonstrated an understanding of the instructions.   The patient was advised to call back or seek an in-person evaluation if the symptoms worsen or if the condition fails to improve as anticipated.  I provided 15 minutes of non-face-to-face time during this encounter.    Historical information moved to improve visibility of documentation.  Past Medical History:  Diagnosis Date  . IBS (irritable bowel syndrome)   . Irregular periods/menstrual cycles 10/10/2015  . Obesity 01/31/2016   Phentermine started 09/2015  . Seasonal allergies    Past Surgical History:  Procedure Laterality Date  . CHOLECYSTECTOMY    . LASIK     Social History   Tobacco Use  . Smoking status: Never Smoker  . Smokeless tobacco: Never Used  Substance Use Topics  . Alcohol use: Yes   family history includes Alcohol abuse in her brother; Cancer in her paternal grandmother; Diabetes in her maternal grandmother; Hyperlipidemia in her father; Hypertension in her mother; Stroke in her maternal grandfather.  Medications: Current Outpatient Medications  Medication Sig Dispense Refill  . ALPRAZolam (XANAX) 0.5 MG tablet Take 0.5  mg by mouth as needed for anxiety.    . Benzoyl Peroxide 5 % lotion APPLY TOPICALLY TO AFFECTED AREA EVERY NIGHT, WASH OFF IN MORNING. NOTE - MAY BLEACH CLOTHING/LINENS 30 mL 0  . celecoxib (CELEBREX) 200 MG capsule One to 2 tablets by mouth daily as needed for pain. 60 capsule 2  . doxepin  (SINEQUAN) 10 MG capsule Take 10 mg by mouth at bedtime.  5  . norgestimate-ethinyl estradiol (MONONESSA) 0.25-35 MG-MCG tablet Take 1 tablet by mouth daily. 84 tablet 3  . omeprazole (PRILOSEC) 40 MG capsule Take 40 mg by mouth 2 (two) times daily.  3  . QSYMIA 15-92 MG CP24 TAKE 1 CAPSULE BY MOUTH EVERY MORNING 30 capsule 2  . sucralfate (CARAFATE) 1 g tablet TAKE 1 TABLET BY MOUTH FOUR TIMES DAILY 120 tablet 0   No current facility-administered medications for this visit.    Allergies  Allergen Reactions  . Codeine Hives, Swelling, Anaphylaxis and Other (See Comments)  . Erythromycin Base Anaphylaxis  . Moxifloxacin Other (See Comments)    Other Reaction: Other reaction  . Penicillins Hives, Swelling and Anaphylaxis  . Sulfa Antibiotics Hives, Swelling and Other (See Comments)    Other Reaction: Other reaction   . Erythromycin Hives and Swelling  . Sulfamethoxazole-Trimethoprim Hives and Swelling    PDMP not reviewed this encounter. No orders of the defined types were placed in this encounter.  No orders of the defined types were placed in this encounter.                                                                  Clementeen Graham, MD

## 2019-02-02 ENCOUNTER — Telehealth: Payer: Self-pay

## 2019-02-02 NOTE — Telephone Encounter (Signed)
As per pharmacy - Qsymia not covered by ins. Pls initiate prior authorization at [phone]. Pt id [id number]. Thanks.

## 2019-02-03 NOTE — Telephone Encounter (Signed)
Approved today ---Qsymia CaseId:[id number];Status:Approved;Review Type:Prior Auth;Coverage Start Date:01/04/2019;Coverage End Date:02/03/2020; Pharmacy aware.

## 2019-03-02 ENCOUNTER — Other Ambulatory Visit: Payer: Self-pay | Admitting: Osteopathic Medicine

## 2019-03-02 DIAGNOSIS — E282 Polycystic ovarian syndrome: Secondary | ICD-10-CM

## 2019-03-06 ENCOUNTER — Ambulatory Visit: Payer: BC Managed Care – PPO | Admitting: Family Medicine

## 2019-03-15 ENCOUNTER — Encounter: Payer: Self-pay | Admitting: Osteopathic Medicine

## 2019-03-17 ENCOUNTER — Ambulatory Visit (INDEPENDENT_AMBULATORY_CARE_PROVIDER_SITE_OTHER): Payer: BC Managed Care – PPO | Admitting: Osteopathic Medicine

## 2019-03-17 ENCOUNTER — Encounter: Payer: Self-pay | Admitting: Osteopathic Medicine

## 2019-03-17 VITALS — Temp 97.6°F | Wt 199.3 lb

## 2019-03-17 DIAGNOSIS — R635 Abnormal weight gain: Secondary | ICD-10-CM | POA: Diagnosis not present

## 2019-03-17 MED ORDER — SAXENDA 18 MG/3ML ~~LOC~~ SOPN: PEN_INJECTOR | SUBCUTANEOUS | 5 refills | Status: DC

## 2019-03-17 NOTE — Progress Notes (Signed)
Virtual Visit via Video (App used: Doximity) Note  I connected with      Kathleen Ortega on 03/17/19 at 10:50 by a telemedicine application and verified that I am speaking with the correct person using two identifiers.  Patient is at home I am in office   I discussed the limitations of evaluation and management by telemedicine and the availability of in person appointments. The patient expressed understanding and agreed to proceed.  History of Present Illness: Kathleen Ortega is a 43 y.o. female who would like to discuss weight gain   Has been on Qsymia for the better part of a year now at maximum dose, initially was doing well but due to hip injury, gyms closed during the pandemic, her exercise has been limited.  She is counting calories religiously on my fitness pal app.  She is walking at least 4 miles about 3-4 times per week.    Wt Readings from Last 3 Encounters:  03/17/19 199 lb 4.8 oz (90.4 kg)  11/10/18 187 lb (84.8 kg)  07/18/18 187 lb 1.6 oz (84.9 kg)          Observations/Objective: Temp 97.6 F (36.4 C) (Oral)   Wt 199 lb 4.8 oz (90.4 kg)   LMP 03/08/2019   BMI 32.17 kg/m  BP Readings from Last 3 Encounters:  11/10/18 (!) 111/57  07/18/18 127/63  05/25/18 111/73   Exam: Normal Speech.    Lab and Radiology Results No results found for this or any previous visit (from the past 72 hour(s)). No results found.     Assessment and Plan: 43 y.o. female with The encounter diagnosis was Abnormal weight gain.   PDMP not reviewed this encounter. No orders of the defined types were placed in this encounter.  Meds ordered this encounter  Medications  . Liraglutide -Weight Management (SAXENDA) 18 MG/3ML SOPN    Sig: Inject 0.6 mg into the skin daily for 7 days, THEN 1.2 mg daily for 7 days, THEN 1.8 mg daily for 7 days, THEN 2.4 mg daily for 7 days, THEN 3 mg daily for 7 days.    Dispense:  9 mL    Refill:  5   Will trial adding Saxenda and see if  this helps.  Patient advised to see drug manufacturer's website for savings cards and activate this before going to the pharmacy, may require authorization process.      Follow Up Instructions: Return in about 3 months (around 06/17/2019) for weight recheck .    I discussed the assessment and treatment plan with the patient. The patient was provided an opportunity to ask questions and all were answered. The patient agreed with the plan and demonstrated an understanding of the instructions.   The patient was advised to call back or seek an in-person evaluation if any new concerns, if symptoms worsen or if the condition fails to improve as anticipated.  25 minutes of non-face-to-face time was provided during this encounter.                      Historical information moved to improve visibility of documentation.  Past Medical History:  Diagnosis Date  . IBS (irritable bowel syndrome)   . Irregular periods/menstrual cycles 10/10/2015  . Obesity 01/31/2016   Phentermine started 09/2015  . Seasonal allergies    Past Surgical History:  Procedure Laterality Date  . CHOLECYSTECTOMY    . LASIK     Social History   Tobacco  Use  . Smoking status: Never Smoker  . Smokeless tobacco: Never Used  Substance Use Topics  . Alcohol use: Yes   family history includes Alcohol abuse in her brother; Cancer in her paternal grandmother; Diabetes in her maternal grandmother; Hyperlipidemia in her father; Hypertension in her mother; Stroke in her maternal grandfather.  Medications: Current Outpatient Medications  Medication Sig Dispense Refill  . ALPRAZolam (XANAX) 0.5 MG tablet Take 0.5 mg by mouth as needed for anxiety.    . Benzoyl Peroxide 5 % lotion APPLY TOPICALLY TO AFFECTED AREA EVERY NIGHT, WASH OFF IN MORNING. NOTE - MAY BLEACH CLOTHING/LINENS 30 mL 0  . celecoxib (CELEBREX) 200 MG capsule One to 2 tablets by mouth daily as needed for pain. 60 capsule 2  . doxepin  (SINEQUAN) 10 MG capsule Take 10 mg by mouth at bedtime.  5  . norgestimate-ethinyl estradiol (ESTARYLLA) 0.25-35 MG-MCG tablet Take 1 tablet by mouth daily. Will need appt before further refills 84 tablet 0  . QSYMIA 15-92 MG CP24 TAKE 1 CAPSULE BY MOUTH EVERY MORNING 30 capsule 2  . sucralfate (CARAFATE) 1 g tablet TAKE 1 TABLET BY MOUTH FOUR TIMES DAILY 120 tablet 0  . Liraglutide -Weight Management (SAXENDA) 18 MG/3ML SOPN Inject 0.6 mg into the skin daily for 7 days, THEN 1.2 mg daily for 7 days, THEN 1.8 mg daily for 7 days, THEN 2.4 mg daily for 7 days, THEN 3 mg daily for 7 days. 9 mL 5  . omeprazole (PRILOSEC) 40 MG capsule Take 40 mg by mouth 2 (two) times daily.  3   No current facility-administered medications for this visit.    Allergies  Allergen Reactions  . Codeine Hives, Swelling, Anaphylaxis and Other (See Comments)  . Erythromycin Base Anaphylaxis  . Moxifloxacin Other (See Comments)    Other Reaction: Other reaction Other Reaction: Other reaction  . Penicillins Hives, Swelling and Anaphylaxis  . Sulfa Antibiotics Hives, Swelling and Other (See Comments)    Other Reaction: Other reaction  Other Reaction: Other reaction  . Erythromycin Hives and Swelling  . Sulfamethoxazole-Trimethoprim Hives and Swelling    PDMP not reviewed this encounter. No orders of the defined types were placed in this encounter.  Meds ordered this encounter  Medications  . Liraglutide -Weight Management (SAXENDA) 18 MG/3ML SOPN    Sig: Inject 0.6 mg into the skin daily for 7 days, THEN 1.2 mg daily for 7 days, THEN 1.8 mg daily for 7 days, THEN 2.4 mg daily for 7 days, THEN 3 mg daily for 7 days.    Dispense:  9 mL    Refill:  5

## 2019-03-21 ENCOUNTER — Telehealth: Payer: Self-pay

## 2019-03-21 NOTE — Telephone Encounter (Signed)
Information has been sent to insurance and waiting on a response.   

## 2019-03-21 NOTE — Telephone Encounter (Signed)
PA needed for Saxenda  Patient ID: [id number]  Phone # [phone]

## 2019-03-22 NOTE — Telephone Encounter (Signed)
Received fax from [facility] that [place] was approved from 03/21/2019 through 07/21/2019. Pharmacy notified and form sent to scan.

## 2019-03-23 ENCOUNTER — Telehealth: Payer: Self-pay

## 2019-03-23 NOTE — Telephone Encounter (Signed)
Kathleen Ortega from [facility] called stating that a msg was rec'd that prior authorization was completed for [place] yesterday. As per pharmacist, no authorization on file. Pharmacy contacted pt's ins and was informed no prior authorization was initiated via phone or cover my meds. Pt is now requesting an update on process. Thanks.

## 2019-03-23 NOTE — Telephone Encounter (Signed)
I called [facility] and per Chrissie Noa he run the prescription and is still getting a PA even though on my end its approved.  I called [facility] and per Harrell Gave the PA is good from [facility] side and approval is good for 4 months.   I then called the pharmacy back to let them know its approved until 07/21/2019.

## 2019-05-20 ENCOUNTER — Other Ambulatory Visit: Payer: Self-pay | Admitting: Osteopathic Medicine

## 2019-05-20 DIAGNOSIS — E282 Polycystic ovarian syndrome: Secondary | ICD-10-CM

## 2019-05-22 ENCOUNTER — Encounter: Payer: Self-pay | Admitting: Osteopathic Medicine

## 2019-05-22 ENCOUNTER — Telehealth: Payer: Self-pay | Admitting: Osteopathic Medicine

## 2019-05-22 NOTE — Telephone Encounter (Signed)
Kathleen Ortega called in needing a refill on her birth control. I did let her know that she didn't need an appt until after 09/12/20020 for weight check per your suggestion.

## 2019-05-22 NOTE — Telephone Encounter (Signed)
Task request was completed by covering provider. Thanks.

## 2019-06-03 ENCOUNTER — Encounter: Payer: Self-pay | Admitting: Osteopathic Medicine

## 2019-06-08 ENCOUNTER — Telehealth: Payer: Self-pay

## 2019-06-08 MED ORDER — SAXENDA 18 MG/3ML ~~LOC~~ SOPN: 3.0000 mg | PEN_INJECTOR | Freq: Every day | SUBCUTANEOUS | 5 refills | Status: AC

## 2019-06-08 NOTE — Telephone Encounter (Signed)
I sent refill to pharmacy, let me know if any other issues  Insurance may not pay for it if she doesn't come in for weight check, she's due soon!    Wt Readings from Last 3 Encounters:  03/17/19 199 lb 4.8 oz (90.4 kg)  11/10/18 187 lb (84.8 kg)  07/18/18 187 lb 1.6 oz (84.9 kg)

## 2019-06-08 NOTE — Telephone Encounter (Signed)
Left a message about refill to pharmacy. Also advised to call back to schedule appointment.

## 2019-06-08 NOTE — Telephone Encounter (Signed)
Pt left a vm msg stating she is unable to get a refill on Saxenda until 06/17/19. Pt states there is 15 mg per pen = 5 day supply. Pt currently gets 3 pens/mth. Pt states monthly quantity is not enough. She is requesting an updated rx to be sent to [facility]. Pls advise, thanks.

## 2019-06-13 ENCOUNTER — Encounter: Payer: Self-pay | Admitting: Osteopathic Medicine

## 2019-06-13 ENCOUNTER — Ambulatory Visit (INDEPENDENT_AMBULATORY_CARE_PROVIDER_SITE_OTHER): Payer: BC Managed Care – PPO | Admitting: Osteopathic Medicine

## 2019-06-13 VITALS — Temp 97.3°F | Wt 194.0 lb

## 2019-06-13 DIAGNOSIS — R635 Abnormal weight gain: Secondary | ICD-10-CM | POA: Diagnosis not present

## 2019-06-13 DIAGNOSIS — Z6831 Body mass index (BMI) 31.0-31.9, adult: Secondary | ICD-10-CM | POA: Diagnosis not present

## 2019-06-13 NOTE — Progress Notes (Signed)
Virtual Visit via Video (App used: Doximity) Note  I connected with      Kathleen Ortega on 06/13/19 at 8:52 AM  by a telemedicine application and verified that I am speaking with the correct person using two identifiers.  Patient is at home I am in office   I discussed the limitations of evaluation and management by telemedicine and the availability of in person appointments. The patient expressed understanding and agreed to proceed.  History of Present Illness: Kathleen Ortega is a 43 y.o. female who would like to discuss weight management  Saxenda doing ok overall, modest weight loss, she has runout of pens a couple times so not totally consistent w/ dosing    Wt Readings from Last 3 Encounters:  06/13/19 194 lb (88 kg)  03/17/19 199 lb 4.8 oz (90.4 kg)  11/10/18 187 lb (84.8 kg)        Observations/Objective: There were no vitals taken for this visit. BP Readings from Last 3 Encounters:  11/10/18 (!) 111/57  07/18/18 127/63  05/25/18 111/73   Exam: Normal Speech.  NAd  Lab and Radiology Results No results found for this or any previous visit (from the past 72 hour(s)). No results found.  Body mass index is 31.31 kg/m.    Assessment and Plan: 43 y.o. female with The primary encounter diagnosis was BMI 31.0-31.9,adult. A diagnosis of Abnormal weight gain was also pertinent to this visit.  Will continue Saxenda Previously on Qsymia and phentermine but unable to tolerate  Saxenda refilled Goal about 10 lbs weight loss (9.7 lbs) by next check-up  .  Instructions sent via MyChart. If MyChart not available, pt was given option for info via personal e-mail w/ no guarantee of protected health info over unsecured e-mail communication, and MyChart sign-up instructions were included.   Follow Up Instructions: Return in about 3 months (around 09/12/2019) for weight check (virtual visit ok) .    I discussed the assessment and treatment plan with the patient. The  patient was provided an opportunity to ask questions and all were answered. The patient agreed with the plan and demonstrated an understanding of the instructions.   The patient was advised to call back or seek an in-person evaluation if any new concerns, if symptoms worsen or if the condition fails to improve as anticipated.  15 minutes of non-face-to-face time was provided during this encounter.                      Historical information moved to improve visibility of documentation.  Past Medical History:  Diagnosis Date  . IBS (irritable bowel syndrome)   . Irregular periods/menstrual cycles 10/10/2015  . Obesity 01/31/2016   Phentermine started 09/2015  . Seasonal allergies    Past Surgical History:  Procedure Laterality Date  . CHOLECYSTECTOMY    . LASIK     Social History   Tobacco Use  . Smoking status: Never Smoker  . Smokeless tobacco: Never Used  Substance Use Topics  . Alcohol use: Yes   family history includes Alcohol abuse in her brother; Cancer in her paternal grandmother; Diabetes in her maternal grandmother; Hyperlipidemia in her father; Hypertension in her mother; Stroke in her maternal grandfather.  Medications: Current Outpatient Medications  Medication Sig Dispense Refill  . ALPRAZolam (XANAX) 0.5 MG tablet Take 0.5 mg by mouth as needed for anxiety.    . Benzoyl Peroxide 5 % lotion APPLY TOPICALLY TO AFFECTED AREA EVERY NIGHT, [place]  OFF IN MORNING. NOTE - MAY BLEACH CLOTHING/LINENS 30 mL 0  . celecoxib (CELEBREX) 200 MG capsule One to 2 tablets by mouth daily as needed for pain. 60 capsule 2  . doxepin (SINEQUAN) 10 MG capsule Take 10 mg by mouth at bedtime.  5  . ESTARYLLA 0.25-35 MG-MCG tablet TAKE 1 TABLET BY MOUTH DAILY 84 tablet 0  . Liraglutide -Weight Management (SAXENDA) 18 MG/3ML SOPN Inject 3 mg into the skin daily. 15 pen 5  . omeprazole (PRILOSEC) 40 MG capsule Take 40 mg by mouth 2 (two) times daily.  3  . sucralfate  (CARAFATE) 1 g tablet TAKE 1 TABLET BY MOUTH FOUR TIMES DAILY 120 tablet 0   No current facility-administered medications for this visit.    Allergies  Allergen Reactions  . Codeine Hives, Swelling, Anaphylaxis and Other (See Comments)  . Erythromycin Base Anaphylaxis  . Moxifloxacin Other (See Comments)    Other Reaction: Other reaction Other Reaction: Other reaction  . Penicillins Hives, Swelling and Anaphylaxis  . Sulfa Antibiotics Hives, Swelling and Other (See Comments)    Other Reaction: Other reaction  Other Reaction: Other reaction  . Erythromycin Hives and Swelling  . Sulfamethoxazole-Trimethoprim Hives and Swelling    PDMP not reviewed this encounter. No orders of the defined types were placed in this encounter.  No orders of the defined types were placed in this encounter.

## 2019-06-13 NOTE — Telephone Encounter (Signed)
-----   Message from Emeterio Reeve, DO sent at 06/13/2019  9:08 AM EDT ----- Follow Up Instructions: Return in about 3 months (around 09/12/2019) for weight check (virtual visit ok) .

## 2019-06-13 NOTE — Telephone Encounter (Signed)
Appointment has been made. No further questions at this time.  

## 2019-06-26 ENCOUNTER — Encounter: Payer: Self-pay | Admitting: Osteopathic Medicine

## 2019-06-26 NOTE — Telephone Encounter (Signed)
Spoke with Pt, scheduled for office eval tomorrow.

## 2019-06-26 NOTE — Telephone Encounter (Signed)
Thanks

## 2019-06-27 ENCOUNTER — Other Ambulatory Visit: Payer: Self-pay

## 2019-06-27 ENCOUNTER — Other Ambulatory Visit: Payer: Self-pay | Admitting: Neurology

## 2019-06-27 ENCOUNTER — Ambulatory Visit: Payer: BC Managed Care – PPO | Admitting: Physician Assistant

## 2019-06-27 ENCOUNTER — Encounter: Payer: Self-pay | Admitting: Physician Assistant

## 2019-06-27 VITALS — BP 115/50 | HR 74 | Ht 67.0 in | Wt 198.0 lb

## 2019-06-27 DIAGNOSIS — Z23 Encounter for immunization: Secondary | ICD-10-CM

## 2019-06-27 DIAGNOSIS — R079 Chest pain, unspecified: Secondary | ICD-10-CM | POA: Diagnosis not present

## 2019-06-27 DIAGNOSIS — E669 Obesity, unspecified: Secondary | ICD-10-CM

## 2019-06-27 DIAGNOSIS — K582 Mixed irritable bowel syndrome: Secondary | ICD-10-CM

## 2019-06-27 DIAGNOSIS — R1011 Right upper quadrant pain: Secondary | ICD-10-CM

## 2019-06-27 DIAGNOSIS — M549 Dorsalgia, unspecified: Secondary | ICD-10-CM | POA: Diagnosis not present

## 2019-06-27 DIAGNOSIS — K219 Gastro-esophageal reflux disease without esophagitis: Secondary | ICD-10-CM

## 2019-06-27 DIAGNOSIS — R748 Abnormal levels of other serum enzymes: Secondary | ICD-10-CM

## 2019-06-27 LAB — CBC WITH DIFFERENTIAL/PLATELET
Absolute Monocytes: 470 cells/uL (ref 200–950)
Basophils Absolute: 41 cells/uL (ref 0–200)
Basophils Relative: 0.5 %
Eosinophils Absolute: 170 cells/uL (ref 15–500)
Eosinophils Relative: 2.1 %
HCT: 40.9 % (ref 35.0–45.0)
Hemoglobin: 13.7 g/dL (ref 11.7–15.5)
Lymphs Abs: 2292 cells/uL (ref 850–3900)
MCH: 30.2 pg (ref 27.0–33.0)
MCHC: 33.5 g/dL (ref 32.0–36.0)
MCV: 90.1 fL (ref 80.0–100.0)
MPV: 10.1 fL (ref 7.5–12.5)
Monocytes Relative: 5.8 %
Neutro Abs: 5127 cells/uL (ref 1500–7800)
Neutrophils Relative %: 63.3 %
Platelets: 374 10*3/uL (ref 140–400)
RBC: 4.54 10*6/uL (ref 3.80–5.10)
RDW: 11.8 % (ref 11.0–15.0)
Total Lymphocyte: 28.3 %
WBC: 8.1 10*3/uL (ref 3.8–10.8)

## 2019-06-27 LAB — COMPLETE METABOLIC PANEL WITH GFR
AG Ratio: 1.4 (calc) (ref 1.0–2.5)
ALT: 22 U/L (ref 6–29)
AST: 14 U/L (ref 10–30)
Albumin: 3.9 g/dL (ref 3.6–5.1)
Alkaline phosphatase (APISO): 65 U/L (ref 31–125)
BUN: 13 mg/dL (ref 7–25)
CO2: 27 mmol/L (ref 20–32)
Calcium: 8.9 mg/dL (ref 8.6–10.2)
Chloride: 106 mmol/L (ref 98–110)
Creat: 0.81 mg/dL (ref 0.50–1.10)
GFR, Est African American: 102 mL/min/{1.73_m2} (ref >=60)
GFR, Est Non African American: 88 mL/min/{1.73_m2} (ref >=60)
Globulin: 2.8 g/dL (calc) (ref 1.9–3.7)
Glucose, Bld: 84 mg/dL (ref 65–99)
Potassium: 4.6 mmol/L (ref 3.5–5.3)
Sodium: 141 mmol/L (ref 135–146)
Total Bilirubin: 0.3 mg/dL (ref 0.2–1.2)
Total Protein: 6.7 g/dL (ref 6.1–8.1)

## 2019-06-27 LAB — AMYLASE: Amylase: 92 U/L (ref 21–101)

## 2019-06-27 LAB — TROPONIN I: Troponin I: <0.01 ng/mL (ref <=0.0)

## 2019-06-27 LAB — LIPASE: Lipase: 150 U/L — ABNORMAL HIGH (ref 7–60)

## 2019-06-27 MED ORDER — ALUM & MAG HYDROXIDE-SIMETH 200-200-20 MG/5ML PO SUSP
30.0000 mL | Freq: Once | ORAL | Status: AC
  Administered 2019-06-27: 30 mL via ORAL

## 2019-06-27 MED ORDER — LINACLOTIDE 145 MCG PO CAPS: 145.0000 ug | ORAL_CAPSULE | Freq: Every day | ORAL | 2 refills | Status: DC

## 2019-06-27 MED ORDER — LIDOCAINE VISCOUS HCL 2 % MT SOLN
15.0000 mL | Freq: Once | OROMUCOSAL | Status: AC
  Administered 2019-06-27: 15 mL via OROMUCOSAL

## 2019-06-27 MED ORDER — HYOSCYAMINE SULFATE 0.125 MG PO TBDP
0.1250 mg | ORAL_TABLET | Freq: Once | ORAL | Status: AC
  Administered 2019-06-27: 0.125 mg via SUBLINGUAL

## 2019-06-27 NOTE — Patient Instructions (Addendum)
Will get labs today.  GI cocktail in office.  Continue on omeprazole and carafate.  Add linzess daily.  Follow up in 2 weeks.

## 2019-06-27 NOTE — Progress Notes (Signed)
CBC looks good.  Kidney and liver look good.  No elevated cardiac enzymes.  Your lipase is elevated. Stop saxenda and recheck on Friday. Call if symptoms worsening or develop nausea/vomiting. This is mild pancreatitis. I would start liquid diet for the next 2 days as well. Giving your body some rest.

## 2019-06-27 NOTE — Progress Notes (Signed)
Subjective:    Patient ID: Kathleen Ortega, female    DOB: September 14, 1976, 43 y.o.   MRN: [id number]  HPI  Pt is a 43 yo female with hx of chronic GERD and IBS with hx of cholecystectomy who presents to the clinic with worsening right upper quadrant and chest pain for the last 5 days. She describes the pain as dull and aching but getting worse and now radiating into back. She is on omeprazole 40mg  bid. She has been taking OTC pepcid/tums/alka seltzer with no relief. She follows up with GI closely and had EGD 1 year ago but suggested she follow up with PCP. She has noticed any melena or hematochezia. She does feel constipation but that is her hx. She does not remember the last BM she has had. No fever, chills, nausea, vomiting. She denies any SOB. No worsening pain with exertion. She does not really have an appetite.   Pt is on saxenda for weight loss that has been recently started.   .. Active Ambulatory Problems    Diagnosis Date Noted  . Allergic rhinitis 10/04/2011  . Cephalalgia 10/04/2011  . Cannot sleep 10/04/2011  . Current tear of meniscus 05/24/2015  . History of cholecystectomy 01/13/2013  . Irregular periods/menstrual cycles 10/10/2015  . Medication management 10/10/2015  . Obesity 01/31/2016  . GERD (gastroesophageal reflux disease) 11/03/2016  . Irritable bowel syndrome with both constipation and diarrhea 06/27/2019  . Right-sided chest pain 06/28/2019  . Pain radiating to back 06/28/2019  . Right upper quadrant abdominal pain 06/28/2019   Resolved Ambulatory Problems    Diagnosis Date Noted  . No Resolved Ambulatory Problems   Past Medical History:  Diagnosis Date  . IBS (irritable bowel syndrome)   . Seasonal allergies       Review of Systems See HPI>     Objective:   Physical Exam Vitals signs reviewed.  Constitutional:      Appearance: Normal appearance.  Cardiovascular:     Rate and Rhythm: Normal rate and regular rhythm.     Pulses: Normal pulses.   Heart sounds: No murmur.  Pulmonary:     Effort: Pulmonary effort is normal.     Breath sounds: Normal breath sounds.  Chest:     Chest wall: No tenderness.  Abdominal:     General: There is no distension.     Palpations: Abdomen is soft.     Tenderness: There is abdominal tenderness. There is no right CVA tenderness, left CVA tenderness, guarding or rebound.     Comments: Some mild diffuse RUQ and epigastric discomfort with abdominal exam.   Neurological:     General: No focal deficit present.     Mental Status: She is alert and oriented to person, place, and time.  Psychiatric:        Mood and Affect: Mood normal.           Assessment & Plan:  Marland KitchenMarland KitchenSafaa was seen today for pain.  Diagnoses and all orders for this visit:  Right-sided chest pain -     COMPLETE METABOLIC PANEL WITH GFR -     Lipase -     CBC with Differential/Platelet -     Amylase -     Troponin I -     lidocaine (XYLOCAINE) 2 % viscous mouth solution 15 mL -     alum & mag hydroxide-simeth (MAALOX/MYLANTA) 200-200-20 MG/5ML suspension 30 mL -     hyoscyamine (ANASPAZ) disintergrating tablet 0.125 mg  Flu vaccine  need -     Flu Vaccine QUAD 36+ mos IM  Pain radiating to back -     COMPLETE METABOLIC PANEL WITH GFR -     Lipase -     CBC with Differential/Platelet -     Amylase -     Troponin I -     lidocaine (XYLOCAINE) 2 % viscous mouth solution 15 mL -     alum & mag hydroxide-simeth (MAALOX/MYLANTA) 200-200-20 MG/5ML suspension 30 mL -     hyoscyamine (ANASPAZ) disintergrating tablet 0.125 mg  Right upper quadrant abdominal pain -     COMPLETE METABOLIC PANEL WITH GFR -     Lipase -     CBC with Differential/Platelet -     Amylase -     Troponin I -     lidocaine (XYLOCAINE) 2 % viscous mouth solution 15 mL -     alum & mag hydroxide-simeth (MAALOX/MYLANTA) 200-200-20 MG/5ML suspension 30 mL -     hyoscyamine (ANASPAZ) disintergrating tablet 0.125 mg  Irritable bowel syndrome with both  constipation and diarrhea -     linaclotide (LINZESS) 145 MCG CAPS capsule; Take 1 capsule (145 mcg total) by mouth daily before breakfast.  Class 1 obesity without serious comorbidity in adult, unspecified BMI, unspecified obesity type  Gastroesophageal reflux disease, esophagitis presence not specified   Unclear etiology of symptoms today. I do not think cardiac. EKG showed no ST depression or elevation. Sinus rhythm with sinus arrhythmia noted. No prior EKG to compare. Will get troponin to confirm.   With pain that radiates to the back need to rule out pancreatitis. Will get labs. This could be due to saxenda. If elevated need to stop saxenda and recheck.  Pt has had prior cholecystomy discussed have been cases where stone was left behind and caused blockage after gallbladder removal. If pain worsening or not improving will consider MRCP for evaluation or consult with GI.   Continue on omeprazole. GI cocktail given in office today. Add carafate with meals since you have at home.   Will call with results. Please call with any worsening nausea, vomiting, fever symptoms.   Marland Kitchen.Spent 30 minutes with patient and greater than 50 percent of visit spent counseling patient regarding treatment plan.

## 2019-06-28 ENCOUNTER — Ambulatory Visit: Payer: BC Managed Care – PPO | Admitting: Physician Assistant

## 2019-06-28 ENCOUNTER — Encounter: Payer: Self-pay | Admitting: Physician Assistant

## 2019-06-28 DIAGNOSIS — R1011 Right upper quadrant pain: Secondary | ICD-10-CM | POA: Insufficient documentation

## 2019-06-28 DIAGNOSIS — M549 Dorsalgia, unspecified: Secondary | ICD-10-CM | POA: Insufficient documentation

## 2019-06-28 DIAGNOSIS — R079 Chest pain, unspecified: Secondary | ICD-10-CM | POA: Insufficient documentation

## 2019-07-01 LAB — LIPASE: Lipase: 71 U/L — ABNORMAL HIGH (ref 7–60)

## 2019-07-03 ENCOUNTER — Encounter: Payer: Self-pay | Admitting: Physician Assistant

## 2019-07-03 DIAGNOSIS — R748 Abnormal levels of other serum enzymes: Secondary | ICD-10-CM

## 2019-07-03 NOTE — Progress Notes (Signed)
Levels have already decreased in half and almost in normal range. If you stopped both saxenda or pantoprazole/omeprazole it could be either but MOST likely saxenda since it is much more known for pancreatitis. How are your symptoms?

## 2019-07-05 NOTE — Telephone Encounter (Signed)
Lipase reordered for Friday.

## 2019-07-05 NOTE — Telephone Encounter (Signed)
Reorder lipase for patient to get drawn Friday.

## 2019-08-02 NOTE — Addendum Note (Signed)
Addended byAnnamaria Helling on: 08/02/2019 08:08 AM   Modules accepted: Orders

## 2019-09-12 ENCOUNTER — Ambulatory Visit (INDEPENDENT_AMBULATORY_CARE_PROVIDER_SITE_OTHER): Payer: BC Managed Care – PPO | Admitting: Osteopathic Medicine

## 2019-09-12 ENCOUNTER — Encounter: Payer: Self-pay | Admitting: Osteopathic Medicine

## 2019-09-12 ENCOUNTER — Other Ambulatory Visit: Payer: Self-pay

## 2019-09-12 VITALS — Temp 97.1°F | Wt 205.0 lb

## 2019-09-12 DIAGNOSIS — R635 Abnormal weight gain: Secondary | ICD-10-CM

## 2019-09-12 MED ORDER — NALTREXONE-BUPROPION HCL ER 8-90 MG PO TB12: ORAL_TABLET | ORAL | 0 refills | Status: DC

## 2019-09-12 NOTE — Progress Notes (Signed)
Virtual Visit via Video (App used: Doximity) Note  I connected with      Kathleen Ortega on 09/12/19 at 7:44 AM  by a telemedicine application and verified that I am speaking with the correct person using two identifiers.  Patient is at work I am in office   I discussed the limitations of evaluation and management by telemedicine and the availability of in person appointments. The patient expressed understanding and agreed to proceed.  History of Present Illness: Kathleen Ortega is a 43 y.o. female who would like to discuss weight     Wt Readings:  09/12/19 today  205 lb - has been out of medications, stopped x2-3 mos at this point d/t IBS.gallbladder problems (following with GI), taking doxepin 20 mg at bedtime, Protonix daily in the morning, Linzess daily, Carafate as needed.   06/13/19 194 lb (88 kg)  03/17/19 199 lb 4.8 oz (90.4 kg)  11/10/18 187 lb (84.8 kg)   Has been on   Phentermine  Qsymia  Saxenda       Observations/Objective: Temp (!) 97.1 F (36.2 C) (Oral)   Wt 205 lb (93 kg)   BMI 32.11 kg/m  BP Readings from Last 3 Encounters:  06/27/19 (!) 115/50  11/10/18 (!) 111/57  07/18/18 127/63   Exam: Normal Speech.  NAD  Lab and Radiology Results No results found for this or any previous visit (from the past 72 hour(s)). No results found.     Assessment and Plan: 43 y.o. female with The encounter diagnosis was Abnormal weight gain.   PDMP not reviewed this encounter. No orders of the defined types were placed in this encounter.  Meds ordered this encounter  Medications  . Naltrexone-buPROPion HCl ER 8-90 MG TB12    Sig: 1 tab daily for week 1, then 1 tab BID for week 2, then 2 tab PO qAM and 1 tab PO qPM for week 3, then 2 tabs BID.    Dispense:  80 tablet    Refill:  0    If PA required: has tried and failed/had adverse events w/ phentermine, qsymia, saxenda   Patient Instructions  I went ahead and sent prescription for Contrave.  If  this does not seem to be helping, we might consider referral to medical weight management/nutritionist.  If you decide you want to start the medication, I please let me know if it is too expensive/not approved, please also see below for link to savings program through the drug manufacturer  [employer]  Return in about 3 months (around 12/11/2019) for RECHECK WEIGHT ON NEW MEDICATION if needed, or see me sooner! Due for annual in next 6 mo.    Instructions sent via MyChart. If MyChart not available, pt was given option for info via personal e-mail w/ no guarantee of protected health info over unsecured e-mail communication, and MyChart sign-up instructions were sent to patient.   Follow Up Instructions: Return in about 3 months (around 12/11/2019) for RECHECK WEIGHT ON NEW MEDICATION if needed, or see me sooner! Due for annual in next 6 mo.    I discussed the assessment and treatment plan with the patient. The patient was provided an opportunity to ask questions and all were answered. The patient agreed with the plan and demonstrated an understanding of the instructions.   The patient was advised to call back or seek an in-person evaluation if any new concerns, if symptoms worsen or if the condition fails to improve as anticipated.  25 minutes  of non-face-to-face time was provided during this encounter.      . . . . . . . . . . . . . Marland Kitchen                   Historical information moved to improve visibility of documentation.  Past Medical History:  Diagnosis Date  . IBS (irritable bowel syndrome)   . Irregular periods/menstrual cycles 10/10/2015  . Obesity 01/31/2016   Phentermine started 09/2015  . Seasonal allergies    Past Surgical History:  Procedure Laterality Date  . CHOLECYSTECTOMY    . LASIK     Social History   Tobacco Use  . Smoking status: Never Smoker  . Smokeless tobacco: Never Used  Substance Use Topics  . Alcohol use: Yes    family history includes Alcohol abuse in her brother; Cancer in her paternal grandmother; Diabetes in her maternal grandmother; Hyperlipidemia in her father; Hypertension in her mother; Stroke in her maternal grandfather.  Medications: Current Outpatient Medications  Medication Sig Dispense Refill  . celecoxib (CELEBREX) 200 MG capsule One to 2 tablets by mouth daily as needed for pain. 60 capsule 2  . doxepin (SINEQUAN) 10 MG capsule Take 10 mg by mouth at bedtime.  5  . ESTARYLLA 0.25-35 MG-MCG tablet TAKE 1 TABLET BY MOUTH DAILY 84 tablet 0  . linaclotide (LINZESS) 145 MCG CAPS capsule Take 1 capsule (145 mcg total) by mouth daily before breakfast. 30 capsule 2  . Multiple Vitamin (MULTIVITAMIN) tablet Take by mouth.    Marland Kitchen omeprazole (PRILOSEC) 40 MG capsule Take 40 mg by mouth 2 (two) times daily.  3  . pantoprazole (PROTONIX) 40 MG tablet Take 40 mg by mouth daily.    . sucralfate (CARAFATE) 1 g tablet TAKE 1 TABLET BY MOUTH FOUR TIMES DAILY 120 tablet 0  . Naltrexone-buPROPion HCl ER 8-90 MG TB12 1 tab daily for week 1, then 1 tab BID for week 2, then 2 tab PO qAM and 1 tab PO qPM for week 3, then 2 tabs BID. 80 tablet 0   No current facility-administered medications for this visit.    Allergies  Allergen Reactions  . Codeine Hives, Swelling, Anaphylaxis and Other (See Comments)  . Erythromycin Base Anaphylaxis  . Moxifloxacin Other (See Comments)    Other Reaction: Other reaction Other Reaction: Other reaction  . Penicillins Hives, Swelling and Anaphylaxis  . Sulfa Antibiotics Hives, Swelling and Other (See Comments)    Other Reaction: Other reaction  Other Reaction: Other reaction  . Erythromycin Hives and Swelling  . Sulfamethoxazole-Trimethoprim Hives and Swelling

## 2019-09-12 NOTE — Patient Instructions (Addendum)
I went ahead and sent prescription for Contrave.  If this does not seem to be helping, we might consider referral to medical weight management/nutritionist.  If you decide you want to start the medication, I please let me know if it is too expensive/not approved, please also see below for link to savings program through the drug manufacturer  http://www.[employer].[email or web address]  Return in about 3 months (around 12/11/2019) for RECHECK WEIGHT ON NEW MEDICATION if needed, or see me sooner! Due for annual in next 6 mo.

## 2019-09-13 ENCOUNTER — Telehealth: Payer: Self-pay | Admitting: Osteopathic Medicine

## 2019-09-13 NOTE — Telephone Encounter (Signed)
Received fax for prior authorization on Contrave sent through cover my meds waiting on determination. - CF °

## 2019-09-14 NOTE — Telephone Encounter (Signed)
Checked on PA on cover my meds and they denied coverage on Contrave due to patient has to try and fail Saxenda before it is approved. - CF

## 2019-09-15 NOTE — Telephone Encounter (Addendum)
Received Notice of Approval for Contrave from [facility] [employer] through CoverMyMeds. The cost for the patient is $50 dollars. They will call patient.

## 2019-09-18 ENCOUNTER — Other Ambulatory Visit: Payer: Self-pay | Admitting: Neurology

## 2019-09-18 DIAGNOSIS — K582 Mixed irritable bowel syndrome: Secondary | ICD-10-CM

## 2019-11-04 ENCOUNTER — Other Ambulatory Visit: Payer: Self-pay | Admitting: Physician Assistant

## 2019-11-04 DIAGNOSIS — E282 Polycystic ovarian syndrome: Secondary | ICD-10-CM

## 2019-11-06 ENCOUNTER — Encounter: Payer: Self-pay | Admitting: Osteopathic Medicine

## 2019-11-06 MED ORDER — NORGESTIMATE-ETH ESTRADIOL 0.25-35 MG-MCG PO TABS: 1.0000 | ORAL_TABLET | Freq: Every day | ORAL | 3 refills | Status: DC

## 2019-11-06 NOTE — Telephone Encounter (Signed)
Patient not due for follow up until June (Seen in December). Tried to approve order and it printed. Dr. Lyn Hollingshead - can you please sign?

## 2019-11-06 NOTE — Telephone Encounter (Signed)
Kathleen Ortega,  This patient had an office visit on 09/12/19, I'm not sure why her medication would be denied.  I see an order plced today but it hasn't been approved.  Can you look into this?  Thanks so much!!  Toniann Fail

## 2020-01-29 ENCOUNTER — Encounter: Payer: Self-pay | Admitting: Osteopathic Medicine

## 2020-05-28 ENCOUNTER — Encounter: Payer: Self-pay | Admitting: Osteopathic Medicine

## 2020-05-28 ENCOUNTER — Encounter: Payer: Self-pay | Admitting: Physician Assistant

## 2020-08-21 ENCOUNTER — Ambulatory Visit: Payer: BC Managed Care – PPO | Admitting: Osteopathic Medicine

## 2020-08-22 ENCOUNTER — Other Ambulatory Visit: Payer: Self-pay

## 2020-08-22 ENCOUNTER — Ambulatory Visit: Payer: BC Managed Care – PPO | Admitting: Osteopathic Medicine

## 2020-08-22 ENCOUNTER — Encounter: Payer: Self-pay | Admitting: Osteopathic Medicine

## 2020-08-22 VITALS — BP 106/70 | HR 80 | Temp 98.2°F | Wt 208.1 lb

## 2020-08-22 DIAGNOSIS — R1013 Epigastric pain: Secondary | ICD-10-CM | POA: Diagnosis not present

## 2020-08-22 DIAGNOSIS — Z0000 Encounter for general adult medical examination without abnormal findings: Secondary | ICD-10-CM

## 2020-08-22 DIAGNOSIS — K582 Mixed irritable bowel syndrome: Secondary | ICD-10-CM | POA: Diagnosis not present

## 2020-08-22 DIAGNOSIS — E669 Obesity, unspecified: Secondary | ICD-10-CM | POA: Diagnosis not present

## 2020-08-22 DIAGNOSIS — E66811 Obesity, class 1: Secondary | ICD-10-CM

## 2020-08-22 NOTE — Progress Notes (Signed)
Kathleen Ortega is a 44 y.o. female who presents to  [facility] at [facility]  today, 08/22/20, seeking care for the following:  . Discuss weight -noting continued weight gain despite eating healthy (following a diabetic diet, daughter is diabetic and they are working on weight loss together), exercise almost daily.  She is feeling she is doing pretty well with portion control.  She has been on several different weight loss medications in the past and at this point would like to try [facility].  Other medications were either not helpful or caused side effects, including phentermine, Contrave, Saxenda.  Wt Readings from Last 3 Encounters:  08/22/20 208 lb 1.9 oz (94.4 kg)  09/12/19 205 lb (93 kg)  06/27/19 198 lb (89.8 kg)        ASSESSMENT & PLAN with other pertinent findings:  The primary encounter diagnosis was Class 1 obesity without serious comorbidity in adult, unspecified BMI, unspecified obesity type. Diagnoses of Annual physical exam, Dyspepsia, and Irritable bowel syndrome with both constipation and diarrhea were also pertinent to this visit.   Labs ordered for future visit. Annual physical / preventive care was NOT performed or billed today.    --> Prescription for will go be written today.  Patient educated on risks versus benefits, potential side effects, let me know if any issues getting the medication or problems with administration.  Patient is familiar with self administration of subcutaneous injections and pen devices.   No results found for this or any previous visit (from the past 24 hour(s)).   There are no Patient Instructions on file for this visit.  Orders Placed This Encounter  Procedures  . CBC  . COMPLETE METABOLIC PANEL WITH GFR  . Lipid panel  . TSH    No orders of the defined types were placed in this encounter.      Follow-up instructions: Return in about 3 months (around 11/22/2020) for ANNUAL CHECK-UP  - SEE [place] SOONER IF NEEDED.                                         BP 106/70 (BP Location: Left Arm, Patient Position: Sitting, Cuff Size: Large)   Pulse 80   Temp 98.2 F (36.8 C) (Oral)   Wt 208 lb 1.9 oz (94.4 kg)   BMI 32.60 kg/m   Current Meds  Medication Sig  . celecoxib (CELEBREX) 200 MG capsule One to 2 tablets by mouth daily as needed for pain.  Marland Kitchen doxepin (SINEQUAN) 10 MG capsule Take 10 mg by mouth at bedtime.  Marland Kitchen linaclotide (LINZESS) 145 MCG CAPS capsule Take 1 capsule (145 mcg total) by mouth daily before breakfast.  . Multiple Vitamin (MULTIVITAMIN) tablet Take by mouth.  . Naltrexone-buPROPion HCl ER 8-90 MG TB12 1 tab daily for week 1, then 1 tab BID for week 2, then 2 tab PO qAM and 1 tab PO qPM for week 3, then 2 tabs BID.  Marland Kitchen norgestimate-ethinyl estradiol (ESTARYLLA) 0.25-35 MG-MCG tablet Take 1 tablet by mouth daily.  Marland Kitchen omeprazole (PRILOSEC) 40 MG capsule Take 40 mg by mouth 2 (two) times daily.  . pantoprazole (PROTONIX) 40 MG tablet Take 40 mg by mouth daily.  . sucralfate (CARAFATE) 1 g tablet TAKE 1 TABLET BY MOUTH FOUR TIMES DAILY    No results found for this or any previous visit (from the past  72 hour(s)).  No results found.     All questions at time of visit were answered - patient instructed to contact office with any additional concerns or updates.  ER/RTC precautions were reviewed with the patient as applicable.   Please note: voice recognition software was used to produce this document, and typos may escape review. Please contact Dr. Lyn Hollingshead for any needed clarifications.

## 2020-09-06 ENCOUNTER — Encounter: Payer: Self-pay | Admitting: Osteopathic Medicine

## 2020-09-06 MED ORDER — WEGOVY 1 MG/0.5ML ~~LOC~~ SOAJ: 1.0000 mg | SUBCUTANEOUS | 0 refills | Status: DC

## 2020-09-06 MED ORDER — WEGOVY 1.7 MG/0.75ML ~~LOC~~ SOAJ: 1.7000 mg | SUBCUTANEOUS | 0 refills | Status: DC

## 2020-09-06 MED ORDER — WEGOVY 0.5 MG/0.5ML ~~LOC~~ SOAJ: 0.5000 mg | SUBCUTANEOUS | 0 refills | Status: DC

## 2020-09-06 MED ORDER — WEGOVY 0.25 MG/0.5ML ~~LOC~~ SOAJ: 0.2500 mg | SUBCUTANEOUS | 0 refills | Status: DC

## 2020-09-06 MED ORDER — WEGOVY 2.4 MG/0.75ML ~~LOC~~ SOAJ: 2.4000 mg | SUBCUTANEOUS | 0 refills | Status: DC

## 2020-09-07 LAB — COMPLETE METABOLIC PANEL WITH GFR
AG Ratio: 1.3 (calc) (ref 1.0–2.5)
ALT: 18 U/L (ref 6–29)
AST: 17 U/L (ref 10–35)
Albumin: 3.7 g/dL (ref 3.6–5.1)
Alkaline phosphatase (APISO): 78 U/L (ref 31–125)
BUN: 12 mg/dL (ref 7–25)
CO2: 26 mmol/L (ref 20–32)
Calcium: 9.2 mg/dL (ref 8.6–10.2)
Chloride: 104 mmol/L (ref 98–110)
Creat: 0.73 mg/dL (ref 0.50–1.10)
GFR, Est African American: 115 mL/min/{1.73_m2} (ref >=60)
GFR, Est Non African American: 99 mL/min/{1.73_m2} (ref >=60)
Globulin: 2.9 g/dL (calc) (ref 1.9–3.7)
Glucose, Bld: 69 mg/dL (ref 65–139)
Potassium: 4.7 mmol/L (ref 3.5–5.3)
Sodium: 138 mmol/L (ref 135–146)
Total Bilirubin: 0.3 mg/dL (ref 0.2–1.2)
Total Protein: 6.6 g/dL (ref 6.1–8.1)

## 2020-09-07 LAB — CBC
HCT: 39.6 % (ref 35.0–45.0)
Hemoglobin: 12.8 g/dL (ref 11.7–15.5)
MCH: 27.8 pg (ref 27.0–33.0)
MCHC: 32.3 g/dL (ref 32.0–36.0)
MCV: 85.9 fL (ref 80.0–100.0)
MPV: 10.4 fL (ref 7.5–12.5)
Platelets: 380 10*3/uL (ref 140–400)
RBC: 4.61 10*6/uL (ref 3.80–5.10)
RDW: 14.7 % (ref 11.0–15.0)
WBC: 7.9 10*3/uL (ref 3.8–10.8)

## 2020-09-07 LAB — TSH: TSH: 2.72 mIU/L

## 2020-09-07 LAB — LIPID PANEL
Cholesterol: 241 mg/dL — ABNORMAL HIGH (ref <200)
HDL: 76 mg/dL (ref >=50)
LDL Cholesterol (Calc): 145 mg/dL (calc) — ABNORMAL HIGH
Non-HDL Cholesterol (Calc): 165 mg/dL (calc) — ABNORMAL HIGH (ref <130)
Total CHOL/HDL Ratio: 3.2 (calc) (ref <5.0)
Triglycerides: 91 mg/dL (ref <150)

## 2020-09-15 ENCOUNTER — Encounter: Payer: Self-pay | Admitting: Osteopathic Medicine

## 2020-09-17 ENCOUNTER — Other Ambulatory Visit: Payer: Self-pay | Admitting: Nurse Practitioner

## 2020-09-17 DIAGNOSIS — Z6831 Body mass index (BMI) 31.0-31.9, adult: Secondary | ICD-10-CM

## 2020-09-17 DIAGNOSIS — E669 Obesity, unspecified: Secondary | ICD-10-CM

## 2020-09-17 MED ORDER — WEGOVY 1 MG/0.5ML ~~LOC~~ SOAJ: 1.0000 mg | SUBCUTANEOUS | 0 refills | Status: DC

## 2020-09-17 MED ORDER — WEGOVY 0.25 MG/0.5ML ~~LOC~~ SOAJ: 0.2500 mg | SUBCUTANEOUS | 0 refills | Status: DC

## 2020-09-17 MED ORDER — WEGOVY 2.4 MG/0.75ML ~~LOC~~ SOAJ: 2.4000 mg | SUBCUTANEOUS | 0 refills | Status: DC

## 2020-09-17 MED ORDER — WEGOVY 0.5 MG/0.5ML ~~LOC~~ SOAJ: 0.5000 mg | SUBCUTANEOUS | 0 refills | Status: DC

## 2020-09-17 MED ORDER — WEGOVY 1.7 MG/0.75ML ~~LOC~~ SOAJ: 1.7000 mg | SUBCUTANEOUS | 0 refills | Status: DC

## 2020-09-17 NOTE — Progress Notes (Signed)
[facility] scripts resent due to cancellation of original scripts by pharmacy due to no prior authorization in place. Will need PA.

## 2020-09-19 ENCOUNTER — Other Ambulatory Visit: Payer: Self-pay

## 2020-09-19 DIAGNOSIS — Z6831 Body mass index (BMI) 31.0-31.9, adult: Secondary | ICD-10-CM

## 2020-09-19 DIAGNOSIS — E669 Obesity, unspecified: Secondary | ICD-10-CM

## 2020-09-19 MED ORDER — WEGOVY 0.25 MG/0.5ML ~~LOC~~ SOAJ: 0.2500 mg | SUBCUTANEOUS | 0 refills | Status: AC

## 2020-09-19 MED ORDER — WEGOVY 0.25 MG/0.5ML ~~LOC~~ SOAJ: 0.2500 mg | SUBCUTANEOUS | 0 refills | Status: DC

## 2020-09-19 MED ORDER — WEGOVY 1 MG/0.5ML ~~LOC~~ SOAJ: 1.0000 mg | SUBCUTANEOUS | 0 refills | Status: AC

## 2020-09-19 MED ORDER — WEGOVY 2.4 MG/0.75ML ~~LOC~~ SOAJ: 2.4000 mg | SUBCUTANEOUS | 0 refills | Status: DC

## 2020-09-19 MED ORDER — WEGOVY 1 MG/0.5ML ~~LOC~~ SOAJ: 1.0000 mg | SUBCUTANEOUS | 0 refills | Status: DC

## 2020-09-19 MED ORDER — WEGOVY 1.7 MG/0.75ML ~~LOC~~ SOAJ: 1.7000 mg | SUBCUTANEOUS | 0 refills | Status: AC

## 2020-09-19 MED ORDER — WEGOVY 0.5 MG/0.5ML ~~LOC~~ SOAJ: 0.5000 mg | SUBCUTANEOUS | 0 refills | Status: DC

## 2020-09-19 MED ORDER — WEGOVY 1.7 MG/0.75ML ~~LOC~~ SOAJ
1.7000 mg | SUBCUTANEOUS | 0 refills | Status: DC
Start: 2020-09-19 — End: 2020-09-19

## 2020-09-19 MED ORDER — WEGOVY 0.5 MG/0.5ML ~~LOC~~ SOAJ: 0.5000 mg | SUBCUTANEOUS | 0 refills | Status: AC

## 2020-09-19 NOTE — Telephone Encounter (Signed)
Sent [phone] to [facility]

## 2020-09-27 ENCOUNTER — Encounter: Payer: Self-pay | Admitting: Osteopathic Medicine

## 2020-09-30 NOTE — Telephone Encounter (Signed)
Rx not listed in active med list.  

## 2020-10-01 MED ORDER — ALPRAZOLAM 0.5 MG PO TABS: 0.5000 mg | ORAL_TABLET | Freq: Two times a day (BID) | ORAL | 0 refills | Status: DC | PRN

## 2020-10-15 ENCOUNTER — Telehealth: Payer: Self-pay | Admitting: Neurology

## 2020-10-15 NOTE — Telephone Encounter (Signed)
Prior Authorization for [employer] submitted via covermymeds. Awaiting response. Your information has been submitted to [employer]. To check for an updated outcome later, reopen this PA request from your dashboard.  If [employer] has not responded to your request within 24 hours, contact [employer] at [phone]. If you think there may be a problem with your PA request, use our live chat feature at the bottom right. 

## 2020-10-16 ENCOUNTER — Encounter: Payer: Self-pay | Admitting: Osteopathic Medicine

## 2020-10-18 NOTE — Telephone Encounter (Addendum)
Prior authorization approval for [employer] from 10/15/20 through 05/15/21. [facility] pharmacy has been updated. PA# [place] [employer] [id number] non-grandfathered [id number] NP.

## 2021-01-03 ENCOUNTER — Other Ambulatory Visit: Payer: Self-pay

## 2021-01-03 DIAGNOSIS — E282 Polycystic ovarian syndrome: Secondary | ICD-10-CM

## 2021-01-03 MED ORDER — NORGESTIMATE-ETH ESTRADIOL 0.25-35 MG-MCG PO TABS: 1.0000 | ORAL_TABLET | Freq: Every day | ORAL | 1 refills | Status: DC

## 2021-01-21 ENCOUNTER — Telehealth: Payer: Self-pay

## 2021-01-21 NOTE — Telephone Encounter (Signed)
Attempted to submitted prior authorization for [facility]. Per cover my meds: Your PA has been resolved, no additional PA is required. For further inquiries please contact the number on the back of the member prescription card.

## 2021-01-22 ENCOUNTER — Other Ambulatory Visit: Payer: Self-pay | Admitting: Osteopathic Medicine

## 2021-01-23 NOTE — Telephone Encounter (Signed)
Routing to covering provider. Thanks!

## 2021-02-23 ENCOUNTER — Other Ambulatory Visit: Payer: Self-pay | Admitting: Nurse Practitioner

## 2021-02-23 DIAGNOSIS — E669 Obesity, unspecified: Secondary | ICD-10-CM

## 2021-04-23 ENCOUNTER — Other Ambulatory Visit: Payer: Self-pay | Admitting: Nurse Practitioner

## 2021-04-23 DIAGNOSIS — E669 Obesity, unspecified: Secondary | ICD-10-CM

## 2021-04-23 DIAGNOSIS — Z6831 Body mass index (BMI) 31.0-31.9, adult: Secondary | ICD-10-CM

## 2021-04-24 ENCOUNTER — Other Ambulatory Visit: Payer: Self-pay | Admitting: Nurse Practitioner

## 2021-04-24 DIAGNOSIS — E669 Obesity, unspecified: Secondary | ICD-10-CM

## 2021-04-24 DIAGNOSIS — Z6831 Body mass index (BMI) 31.0-31.9, adult: Secondary | ICD-10-CM

## 2021-04-27 ENCOUNTER — Other Ambulatory Visit: Payer: Self-pay | Admitting: Nurse Practitioner

## 2021-04-27 DIAGNOSIS — E669 Obesity, unspecified: Secondary | ICD-10-CM

## 2021-05-01 ENCOUNTER — Ambulatory Visit: Payer: BC Managed Care – PPO | Admitting: Family Medicine

## 2021-05-01 NOTE — Progress Notes (Signed)
Acute Office Visit  Subjective:    Patient ID: Kathleen Ortega, female    DOB: Mar 23, 1976, 45 y.o.   MRN: [id number]  Chief Complaint  Patient presents with   Adenopathy    HPI Patient is in today for bilateral axillary lymphadenopathy.   About 4 days ago, patient noticed an sore and swollen lymph node in right axillary region followed by a noticeable lymph node to left axillary yesterday. She states both areas are already getting smaller and less sore since she has been using warm compresses. She says the pain has only been about 3/10 soreness with palpation or pressure over the areas. She denies any breast/nipple changes, fevers, sickness, rashes, body aches, changes to soaps/detergents, recent vaccines, known sick contacts. She has been a little more tired over the past week or so since getting back from a vacation. Reports she had a normal mammogram last October, no history of abnormal findings on screening, but she does have a grandmother who had breast cancer.      Past Medical History:  Diagnosis Date   IBS (irritable bowel syndrome)    Irregular periods/menstrual cycles 10/10/2015   Obesity 01/31/2016   Phentermine started 09/2015   Seasonal allergies     Past Surgical History:  Procedure Laterality Date   CHOLECYSTECTOMY     LASIK      Family History  Problem Relation Age of Onset   Hypertension Mother    Hyperlipidemia Father    Alcohol abuse Brother    Diabetes Maternal Grandmother    Stroke Maternal Grandfather    Cancer Paternal Grandmother        breast    Social History   Socioeconomic History   Marital status: Married    Spouse name: Not on file   Number of children: Not on file   Years of education: Not on file   Highest education level: Not on file  Occupational History   Not on file  Tobacco Use   Smoking status: Never   Smokeless tobacco: Never  Substance and Sexual Activity   Alcohol use: Yes   Drug use: No   Sexual activity: Yes   Other Topics Concern   Not on file  Social History Narrative   Not on file   Social Determinants of Health   Financial Resource Strain: Not on file  Food Insecurity: Not on file  Transportation Needs: Not on file  Physical Activity: Not on file  Stress: Not on file  Social Connections: Not on file  Intimate Partner Violence: Not on file    Outpatient Medications Prior to Visit  Medication Sig Dispense Refill   ALPRAZolam (XANAX) 0.5 MG tablet TAKE 1 TABLET(0.5 MG) BY MOUTH TWICE DAILY AS NEEDED FOR ANXIETY 30 tablet 0   celecoxib (CELEBREX) 200 MG capsule One to 2 tablets by mouth daily as needed for pain. 60 capsule 2   doxepin (SINEQUAN) 10 MG capsule Take 10 mg by mouth at bedtime.  5   linaclotide (LINZESS) 145 MCG CAPS capsule Take 1 capsule (145 mcg total) by mouth daily before breakfast. 30 capsule 2   Multiple Vitamin (MULTIVITAMIN) tablet Take by mouth.     norgestimate-ethinyl estradiol (ESTARYLLA) 0.25-35 MG-MCG tablet Take 1 tablet by mouth daily. 84 tablet 1   omeprazole (PRILOSEC) 40 MG capsule Take 40 mg by mouth 2 (two) times daily.  3   pantoprazole (PROTONIX) 40 MG tablet Take 40 mg by mouth daily.     Semaglutide-Weight Management (WEGOVY) 2.4  MG/0.75ML SOAJ Inject 2.4 mg into the skin once a week. Start after finishing 1.7 mg per week for 4 doses 3 mL 0   sucralfate (CARAFATE) 1 g tablet TAKE 1 TABLET BY MOUTH FOUR TIMES DAILY 120 tablet 0   No facility-administered medications prior to visit.    Allergies  Allergen Reactions   Codeine Hives, Swelling, Anaphylaxis and Other (See Comments)   Erythromycin Base Anaphylaxis   Moxifloxacin Other (See Comments)    Other Reaction: Other reaction Other Reaction: Other reaction   Penicillins Hives, Swelling and Anaphylaxis   Sulfa Antibiotics Hives, Swelling and Other (See Comments)    Other Reaction: Other reaction  Other Reaction: Other reaction   Erythromycin Hives and Swelling    Sulfamethoxazole-Trimethoprim Hives and Swelling    Review of Systems All review of systems negative except what is listed in the HPI      Objective:    Physical Exam Vitals reviewed.  Constitutional:      Appearance: Normal appearance.  HENT:     Head: Normocephalic and atraumatic.  Neck:     Comments: 1 palpable lymph node to right axillary, pea size, mobile, tender, no erythema About 2 smaller (<1cm) lymph nodes palpable to left axillary chain, tender, mobile, no erythema No breast changes, masses, nipple discharge, asymmetry  Cardiovascular:     Rate and Rhythm: Normal rate and regular rhythm.     Heart sounds: Normal heart sounds.  Pulmonary:     Effort: Pulmonary effort is normal.     Breath sounds: Normal breath sounds.  Musculoskeletal:        General: No swelling.  Skin:    General: Skin is warm and dry.     Capillary Refill: Capillary refill takes less than 2 seconds.     Findings: No rash.  Neurological:     General: No focal deficit present.     Mental Status: She is alert. Mental status is at baseline.  Psychiatric:        Mood and Affect: Mood normal.        Behavior: Behavior normal.        Thought Content: Thought content normal.        Judgment: Judgment normal.    BP 102/70   Pulse 72   Temp 98 F (36.7 C)   Resp 17   SpO2 99%  Wt Readings from Last 3 Encounters:  08/22/20 208 lb 1.9 oz (94.4 kg)  09/12/19 205 lb (93 kg)  06/27/19 198 lb (89.8 kg)    Health Maintenance Due  Topic Date Due   Pneumococcal Vaccine 12-27 Years old (1 - PCV) Never done   Hepatitis C Screening  Never done   MAMMOGRAM  06/22/2020   COLONOSCOPY (Pts 45-15yrs Insurance coverage will need to be confirmed)  Never done   COVID-19 Vaccine (4 - Booster for Pfizer series) 11/14/2020    There are no preventive care reminders to display for this patient.   Lab Results  Component Value Date   TSH 2.72 09/06/2020   Lab Results  Component Value Date   WBC 7.9  09/06/2020   HGB 12.8 09/06/2020   HCT 39.6 09/06/2020   MCV 85.9 09/06/2020   PLT 380 09/06/2020   Lab Results  Component Value Date   NA 138 09/06/2020   K 4.7 09/06/2020   CO2 26 09/06/2020   GLUCOSE 69 09/06/2020   BUN 12 09/06/2020   CREATININE 0.73 09/06/2020   BILITOT 0.3 09/06/2020   ALKPHOS  66 04/23/2017   AST 17 09/06/2020   ALT 18 09/06/2020   PROT 6.6 09/06/2020   ALBUMIN 3.7 04/23/2017   CALCIUM 9.2 09/06/2020   Lab Results  Component Value Date   CHOL 241 (H) 09/06/2020   Lab Results  Component Value Date   HDL 76 09/06/2020   Lab Results  Component Value Date   LDLCALC 145 (H) 09/06/2020   Lab Results  Component Value Date   TRIG 91 09/06/2020   Lab Results  Component Value Date   CHOLHDL 3.2 09/06/2020   No results found for: HGBA1C     Assessment & Plan:    1. Axillary lymphadenopathy - bilateral  Given bilateral involvement, no other breast changes, and symptoms starting to improve already, viral etiology is more likely. Encouraged patient to continue warm compresses and tylenol/ibuprofen as needed. Educated that it can take several weeks for lymph nodes to return to normal size. Encouraged her to keep monitoring the area. If not noticing any improvement in the next 2-3 weeks, or if symptoms worsen or new symptoms develop before then, we can do further workup with labs and breast imaging. Otherwise, if continuing to improve, plan on routine mammogram as scheduled for this Fall. Patient aware of signs/symptoms requiring further/urgent evaluation.  Follow-up in 2-3 weeks or sooner if needed.   Lollie Marrow Reola Calkins, DNP, FNP-C

## 2021-05-02 ENCOUNTER — Other Ambulatory Visit: Payer: Self-pay

## 2021-05-02 ENCOUNTER — Ambulatory Visit (INDEPENDENT_AMBULATORY_CARE_PROVIDER_SITE_OTHER): Payer: BC Managed Care – PPO | Admitting: Family Medicine

## 2021-05-02 ENCOUNTER — Encounter: Payer: Self-pay | Admitting: Family Medicine

## 2021-05-02 VITALS — BP 102/70 | HR 72 | Temp 98.0°F | Resp 17

## 2021-05-02 DIAGNOSIS — R590 Localized enlarged lymph nodes: Secondary | ICD-10-CM | POA: Diagnosis not present

## 2021-05-05 ENCOUNTER — Other Ambulatory Visit: Payer: Self-pay

## 2021-05-05 DIAGNOSIS — E669 Obesity, unspecified: Secondary | ICD-10-CM

## 2021-05-05 MED ORDER — WEGOVY 2.4 MG/0.75ML ~~LOC~~ SOAJ: 2.4000 mg | SUBCUTANEOUS | 0 refills | Status: DC

## 2021-05-05 NOTE — Telephone Encounter (Signed)
[facility] pharmacy requesting med refill for [facility]. Rx pended.

## 2021-06-01 ENCOUNTER — Other Ambulatory Visit: Payer: Self-pay | Admitting: Nurse Practitioner

## 2021-06-01 DIAGNOSIS — Z6831 Body mass index (BMI) 31.0-31.9, adult: Secondary | ICD-10-CM

## 2021-06-01 DIAGNOSIS — E669 Obesity, unspecified: Secondary | ICD-10-CM

## 2021-06-02 ENCOUNTER — Other Ambulatory Visit: Payer: Self-pay | Admitting: Nurse Practitioner

## 2021-06-02 DIAGNOSIS — E669 Obesity, unspecified: Secondary | ICD-10-CM

## 2021-06-05 ENCOUNTER — Other Ambulatory Visit: Payer: Self-pay | Admitting: Osteopathic Medicine

## 2021-06-05 DIAGNOSIS — E282 Polycystic ovarian syndrome: Secondary | ICD-10-CM

## 2021-06-05 DIAGNOSIS — Z6831 Body mass index (BMI) 31.0-31.9, adult: Secondary | ICD-10-CM

## 2021-06-05 DIAGNOSIS — E669 Obesity, unspecified: Secondary | ICD-10-CM

## 2021-06-06 ENCOUNTER — Other Ambulatory Visit: Payer: Self-pay

## 2021-06-06 DIAGNOSIS — E282 Polycystic ovarian syndrome: Secondary | ICD-10-CM

## 2021-06-06 MED ORDER — NORGESTIMATE-ETH ESTRADIOL 0.25-35 MG-MCG PO TABS: 1.0000 | ORAL_TABLET | Freq: Every day | ORAL | 1 refills | Status: DC

## 2021-06-08 ENCOUNTER — Encounter: Payer: Self-pay | Admitting: Osteopathic Medicine

## 2021-06-19 ENCOUNTER — Telehealth: Payer: Self-pay

## 2021-06-19 NOTE — Telephone Encounter (Signed)
Medication: Semaglutide-Weight Management (WEGOVY) 2.4 MG/0.75ML SOAJ Prior authorization determination received Medication has been approved Approval dates: 05/20/2021-01/15/2022  Patient aware via: MyChart Pharmacy aware: Yes Provider aware via this encounter

## 2021-07-14 NOTE — Telephone Encounter (Signed)
Medication: Semaglutide-Weight Management (WEGOVY) 2.4 MG/0.75ML SOAJ Prior authorization determination received Medication has been approved Approval dates: 05/20/2021-01/15/2022   Patient aware via: MyChart as well as by telephone call

## 2021-07-30 LAB — HM MAMMOGRAPHY

## 2021-10-31 ENCOUNTER — Telehealth: Payer: Self-pay | Admitting: Sports Medicine

## 2021-10-31 DIAGNOSIS — E669 Obesity, unspecified: Secondary | ICD-10-CM

## 2021-10-31 DIAGNOSIS — Z6831 Body mass index (BMI) 31.0-31.9, adult: Secondary | ICD-10-CM

## 2021-10-31 MED ORDER — WEGOVY 2.4 MG/0.75ML ~~LOC~~ SOAJ: 2.4000 mg | SUBCUTANEOUS | 5 refills | Status: DC

## 2021-10-31 NOTE — Telephone Encounter (Signed)
PT called stating that her new pharmacy needs a pre-authorization for [facility]. She has updated her pharmacy on MyChart. She has also scheduled her transfer of care with [facility] for sometime in March.

## 2021-10-31 NOTE — Telephone Encounter (Signed)
Refilled medication to the new pharmacy, PA will go to [facility].

## 2021-11-03 ENCOUNTER — Telehealth: Payer: Self-pay

## 2021-11-03 NOTE — Telephone Encounter (Signed)
Medication: Semaglutide-Weight Management (WEGOVY) 2.4 MG/0.75ML SOAJ Prior authorization submitted via CoverMyMeds on 11/03/2021 PA submission pending

## 2021-11-11 ENCOUNTER — Telehealth: Payer: Self-pay

## 2021-11-11 ENCOUNTER — Encounter: Payer: Self-pay | Admitting: Medical-Surgical

## 2021-11-11 ENCOUNTER — Other Ambulatory Visit: Payer: Self-pay

## 2021-11-11 DIAGNOSIS — E282 Polycystic ovarian syndrome: Secondary | ICD-10-CM

## 2021-11-11 MED ORDER — NORGESTIMATE-ETH ESTRADIOL 0.25-35 MG-MCG PO TABS: 1.0000 | ORAL_TABLET | Freq: Every day | ORAL | 0 refills | Status: DC

## 2021-11-11 NOTE — Telephone Encounter (Addendum)
Initiated Prior authorization MAU:[id number] Management (WEGOVY) 2.4 MG/0.75ML SOAJ Via: Covermymeds Case/Key:[id number] Status: approved as of 11/11/21 Reason:refill too soon 11/25/21 Notified Pt via: Mychart

## 2021-11-18 ENCOUNTER — Other Ambulatory Visit: Payer: Self-pay

## 2021-12-26 ENCOUNTER — Other Ambulatory Visit: Payer: Self-pay | Admitting: Medical-Surgical

## 2021-12-26 DIAGNOSIS — E282 Polycystic ovarian syndrome: Secondary | ICD-10-CM

## 2021-12-31 ENCOUNTER — Other Ambulatory Visit: Payer: Self-pay

## 2021-12-31 ENCOUNTER — Encounter: Payer: Self-pay | Admitting: Medical-Surgical

## 2021-12-31 ENCOUNTER — Ambulatory Visit (INDEPENDENT_AMBULATORY_CARE_PROVIDER_SITE_OTHER): Payer: BC Managed Care – PPO | Admitting: Medical-Surgical

## 2021-12-31 VITALS — BP 107/74 | HR 73 | Ht 67.0 in | Wt 152.0 lb

## 2021-12-31 DIAGNOSIS — G4700 Insomnia, unspecified: Secondary | ICD-10-CM | POA: Diagnosis not present

## 2021-12-31 DIAGNOSIS — E282 Polycystic ovarian syndrome: Secondary | ICD-10-CM

## 2021-12-31 DIAGNOSIS — Z1329 Encounter for screening for other suspected endocrine disorder: Secondary | ICD-10-CM

## 2021-12-31 DIAGNOSIS — Z79899 Other long term (current) drug therapy: Secondary | ICD-10-CM

## 2021-12-31 DIAGNOSIS — Z3041 Encounter for surveillance of contraceptive pills: Secondary | ICD-10-CM

## 2021-12-31 DIAGNOSIS — Z13220 Encounter for screening for lipoid disorders: Secondary | ICD-10-CM

## 2021-12-31 DIAGNOSIS — F32A Depression, unspecified: Secondary | ICD-10-CM

## 2021-12-31 DIAGNOSIS — K2100 Gastro-esophageal reflux disease with esophagitis, without bleeding: Secondary | ICD-10-CM

## 2021-12-31 DIAGNOSIS — Z7689 Persons encountering health services in other specified circumstances: Secondary | ICD-10-CM | POA: Diagnosis not present

## 2021-12-31 DIAGNOSIS — Z131 Encounter for screening for diabetes mellitus: Secondary | ICD-10-CM

## 2021-12-31 MED ORDER — NORGESTIMATE-ETH ESTRADIOL 0.25-35 MG-MCG PO TABS: 1.0000 | ORAL_TABLET | Freq: Every day | ORAL | 3 refills | Status: DC

## 2021-12-31 NOTE — Progress Notes (Signed)
?  HPI with pertinent ROS:  ? ?CC: transfer of care ? ?HPI: ?Pleasant 46 year old female presenting today to transfer care to a new PCP.  She is doing well overall and has no current complaints. ? ?GERD-managed by GI but she has been putting them on for a while because they want to do a colonoscopy and she has difficulty taking 2 days off of the prep and procedure to be completed.  She does have Linzess but has not had to use this in a while.  She is taking omeprazole and Carafate as prescribed.  Notes that doxepin at night helps settle her stomach as well. ? ?Depression/insomnia-she does take doxepin 10 mg nightly and has Xanax to use for situational anxiety.  She averages about 2 doses of Xanax a week and notes that in the last 2 months, she has used it more often than she has in the last year.  She does have several stressors going on including the loss of 2 family members as well as a child with type 1 diabetes requiring a couple of visits to the urgent care.  Requesting a refill on Xanax. ? ?Birth control-has been on oral contraception long-term.  Notes that helps with her PCOS.  She is currently sexually active but has no concern for pregnancy and has not missed any pills.  Requesting a refill. ? ?Weight management-approximately 1 year ago, she was 214 pounds and today weighed in at 152 pounds.  She is using Wegovy 2.4 mg weekly as prescribed, tolerating well without significant side effects.  Notes that the [facility] actually helps with some of her constipation issues.  Feels that she is at a good weight and is currently on a maintenance dose. ? ?I reviewed the past medical history, family history, social history, surgical history, and allergies today and no changes were needed.  Please see the problem list section below in epic for further details. ? ? ?Physical exam:  ? ?General: Well Developed, well nourished, and in no acute distress.  ?Neuro: Alert and oriented x3.  ?HEENT: Normocephalic, atraumatic.  ?Skin:  Warm and dry. ?Cardiac: Regular rate and rhythm, no murmurs rubs or gallops, no lower extremity edema.  ?Respiratory: Clear to auscultation bilaterally. Not using accessory muscles, speaking in full sentences. ? ?Impression and Recommendations:   ? ?1. Encounter to establish care ?Reviewed available information and discussed care concerns with patient.  ? ?2. Depressive disorder ?3. Insomnia, unspecified type ?Continue doxepin 10 mg nightly.  Continue very sparing use of Xanax and avoid using this for sleep. ? ?4. Gastroesophageal reflux disease with esophagitis without hemorrhage ?Follow-up with GI.  Continue current medications ? ?5. Oral contraceptive pill surveillance ?6. PCOS (polycystic ovarian syndrome) ?Continue oral contraceptives.  Refills sent. ?- norgestimate-ethinyl estradiol (ORTHO-CYCLEN) 0.25-35 MG-MCG tablet; Take 1 tablet by mouth daily.  Dispense: 84 tablet; Refill: 3 ? ?7. Encounter for weight management ?Doing extremely well on Wegovy and is at maintenance dose.  Continue 2.4 mg weekly. ? ?8. Medication management ?Checking labs as below. ?- TSH ?- Lipid Panel w/reflex Direct LDL ?- COMPLETE METABOLIC PANEL WITH GFR ?- CBC with Differential/Platelet ? ?9. Lipid screening ?Checking lipid panel. ?- Lipid Panel w/reflex Direct LDL ? ?10. Diabetes mellitus screening ?Checking CMP. ?- COMPLETE METABOLIC PANEL WITH GFR ? ?11. Thyroid disorder screen ?Checking TSH. ?- TSH ? ?Return in about 6 months (around 07/03/2022) for chronic disease follow up. ?___________________________________________ ?Thayer Ohm, DNP, APRN, FNP-BC ?Primary Care and Sports Medicine ?[name] ?

## 2022-01-07 LAB — CBC WITH DIFFERENTIAL/PLATELET
Absolute Monocytes: 450 cells/uL (ref 200–950)
Basophils Absolute: 68 cells/uL (ref 0–200)
Basophils Relative: 0.9 %
Eosinophils Absolute: 113 cells/uL (ref 15–500)
Eosinophils Relative: 1.5 %
HCT: 42.1 % (ref 35.0–45.0)
Hemoglobin: 14 g/dL (ref 11.7–15.5)
Lymphs Abs: 2610 cells/uL (ref 850–3900)
MCH: 30 pg (ref 27.0–33.0)
MCHC: 33.3 g/dL (ref 32.0–36.0)
MCV: 90.1 fL (ref 80.0–100.0)
MPV: 10.2 fL (ref 7.5–12.5)
Monocytes Relative: 6 %
Neutro Abs: 4260 cells/uL (ref 1500–7800)
Neutrophils Relative %: 56.8 %
Platelets: 380 10*3/uL (ref 140–400)
RBC: 4.67 10*6/uL (ref 3.80–5.10)
RDW: 12.1 % (ref 11.0–15.0)
Total Lymphocyte: 34.8 %
WBC: 7.5 10*3/uL (ref 3.8–10.8)

## 2022-01-07 LAB — LIPID PANEL W/REFLEX DIRECT LDL
Cholesterol: 228 mg/dL — ABNORMAL HIGH (ref <200)
HDL: 63 mg/dL (ref >=50)
LDL Cholesterol (Calc): 146 mg/dL (calc) — ABNORMAL HIGH
Non-HDL Cholesterol (Calc): 165 mg/dL (calc) — ABNORMAL HIGH (ref <130)
Total CHOL/HDL Ratio: 3.6 (calc) (ref <5.0)
Triglycerides: 89 mg/dL (ref <150)

## 2022-01-07 LAB — COMPLETE METABOLIC PANEL WITH GFR
AG Ratio: 1.5 (calc) (ref 1.0–2.5)
ALT: 14 U/L (ref 6–29)
AST: 13 U/L (ref 10–35)
Albumin: 4 g/dL (ref 3.6–5.1)
Alkaline phosphatase (APISO): 59 U/L (ref 31–125)
BUN: 10 mg/dL (ref 7–25)
CO2: 28 mmol/L (ref 20–32)
Calcium: 9.4 mg/dL (ref 8.6–10.2)
Chloride: 105 mmol/L (ref 98–110)
Creat: 0.81 mg/dL (ref 0.50–0.99)
Globulin: 2.7 g/dL (calc) (ref 1.9–3.7)
Glucose, Bld: 82 mg/dL (ref 65–99)
Potassium: 4.1 mmol/L (ref 3.5–5.3)
Sodium: 139 mmol/L (ref 135–146)
Total Bilirubin: 0.7 mg/dL (ref 0.2–1.2)
Total Protein: 6.7 g/dL (ref 6.1–8.1)
eGFR: 91 mL/min/{1.73_m2} (ref >=60)

## 2022-01-07 LAB — TSH: TSH: 1.24 mIU/L

## 2022-02-09 ENCOUNTER — Other Ambulatory Visit: Payer: Self-pay | Admitting: Medical-Surgical

## 2022-02-10 MED ORDER — DOXEPIN HCL 10 MG PO CAPS: 10.0000 mg | ORAL_CAPSULE | Freq: Every day | ORAL | 3 refills | Status: DC

## 2022-03-20 ENCOUNTER — Encounter: Payer: Self-pay | Admitting: Medical-Surgical

## 2022-03-23 ENCOUNTER — Other Ambulatory Visit: Payer: Self-pay | Admitting: Family Medicine

## 2022-03-23 MED ORDER — DOXEPIN HCL 10 MG PO CAPS: 20.0000 mg | ORAL_CAPSULE | Freq: Every day | ORAL | 3 refills | Status: DC

## 2022-04-23 ENCOUNTER — Encounter: Payer: Self-pay | Admitting: Medical-Surgical

## 2022-04-23 MED ORDER — HYDROCORTISONE (PERIANAL) 2.5 % EX CREA: 1.0000 | TOPICAL_CREAM | Freq: Two times a day (BID) | CUTANEOUS | 1 refills | Status: DC

## 2022-04-23 MED ORDER — HYDROCORTISONE (PERIANAL) 2.5 % EX CREA
1.0000 | TOPICAL_CREAM | Freq: Two times a day (BID) | CUTANEOUS | 1 refills | Status: DC
Start: 2022-04-23 — End: 2024-01-19

## 2022-04-24 ENCOUNTER — Ambulatory Visit: Payer: BC Managed Care – PPO | Admitting: Medical-Surgical

## 2022-04-30 NOTE — Progress Notes (Signed)
   Established Patient Office Visit  Subjective   Patient ID: Kathleen Ortega, female   DOB: 1975/10/24 Age: 46 y.o. MRN: [id number]   Chief Complaint  Patient presents with   Gynecologic Exam    HPI Pleasant 46 year old female presenting today for completion of her Pap smear.  Previously had preventative care appointment in April however at that time, declined to have a Pap smear completed at that appointment. Today, she denies vaginal irritation, discharge, lesions, rashes, and itching. No abnormal vaginal bleeding. No concerns for STIs.  She is sexually active with her husband in a monogamous relationship.   Objective:    Vitals:   05/01/22 1313  BP: 110/78  Pulse: 85  Resp: 20  Height: 5\' 7"  (1.702 m)  Weight: 148 lb 4.8 oz (67.3 kg)  SpO2: 100%  BMI (Calculated): 23.22    Physical Exam Vitals and nursing note reviewed.  Constitutional:      General: She is not in acute distress.    Appearance: Normal appearance. She is not ill-appearing.  HENT:     Head: Normocephalic and atraumatic.  Cardiovascular:     Rate and Rhythm: Normal rate and regular rhythm.  Pulmonary:     Effort: Pulmonary effort is normal. No respiratory distress.  Skin:    General: Skin is warm and dry.  Neurological:     Mental Status: She is alert and oriented to person, place, and time.  Psychiatric:        Mood and Affect: Mood normal.        Behavior: Behavior normal.        Thought Content: Thought content normal.        Judgment: Judgment normal.   No results found for this or any previous visit (from the past 24 hour(s)).     The 10-year ASCVD risk score (Arnett DK, et al., 2019) is: 0.7%   Values used to calculate the score:     Age: 4 years     Sex: Female     Is Non-Hispanic African American: No     Diabetic: No     Tobacco smoker: No     Systolic Blood Pressure: 110 mmHg     Is BP treated: No     HDL Cholesterol: 63 mg/dL     Total Cholesterol: 228 mg/dL   Assessment &  Plan:   1. Cervical cancer screening Pap smear with HPV cotesting today.  Return if symptoms worsen or fail to improve.  ___________________________________________ 48, DNP, APRN, FNP-BC Primary Care and Sports Medicine [facility] [place]

## 2022-05-01 ENCOUNTER — Other Ambulatory Visit (HOSPITAL_COMMUNITY)
Admission: RE | Admit: 2022-05-01 | Discharge: 2022-05-01 | Disposition: A | Discharge status: Home / Self Care | Payer: BC Managed Care – PPO | Source: Ambulatory Visit | Attending: Medical-Surgical | Admitting: Medical-Surgical

## 2022-05-01 ENCOUNTER — Ambulatory Visit (INDEPENDENT_AMBULATORY_CARE_PROVIDER_SITE_OTHER): Payer: BC Managed Care – PPO | Admitting: Medical-Surgical

## 2022-05-01 ENCOUNTER — Encounter: Payer: Self-pay | Admitting: Medical-Surgical

## 2022-05-01 VITALS — BP 110/78 | HR 85 | Resp 20 | Ht 67.0 in | Wt 148.3 lb

## 2022-05-01 DIAGNOSIS — Z124 Encounter for screening for malignant neoplasm of cervix: Secondary | ICD-10-CM

## 2022-05-06 LAB — CYTOLOGY - PAP
Adequacy: ABSENT
Comment: NEGATIVE
Diagnosis: NEGATIVE
High risk HPV: NEGATIVE

## 2022-05-09 ENCOUNTER — Encounter: Payer: Self-pay | Admitting: Medical-Surgical

## 2022-05-09 DIAGNOSIS — E669 Obesity, unspecified: Secondary | ICD-10-CM

## 2022-05-11 MED ORDER — WEGOVY 2.4 MG/0.75ML ~~LOC~~ SOAJ: 2.4000 mg | SUBCUTANEOUS | 1 refills | Status: DC

## 2022-07-03 ENCOUNTER — Ambulatory Visit: Payer: BC Managed Care – PPO | Admitting: Medical-Surgical

## 2022-07-19 NOTE — Progress Notes (Addendum)
Established Patient Office Visit  Subjective   Patient ID: Kathleen Ortega, female   DOB: 07/10/1976 Age: 46 y.o. MRN: [id number]   Chief Complaint  Patient presents with   Medication Refill    HPI Pleasant 46 year old female presenting today for follow-up on weight loss efforts.  She is taking Wegovy 2.4 mg weekly, tolerating well without side effects.  Feels that she is on the maintenance dose and is doing very well for her.  She notes that her weight fluctuates a pound or 2 here and there however she has been very stable.  Her ultimate goal weight is 140 pounds unclothed.  This morning she was at 144 pounds.  Reports that this is not a big deal and she is happy with her results so far.  Would like to continue the medication as prescribed.  Working to make healthy dietary choices although the hollowing candy is out and presents quite a bit of temptation.  Not exercising as often as she was but does aim for at least 3 days/week doing mostly cardio activities.   Objective:    Vitals:   07/20/22 1504  BP: 96/63  Pulse: 77  Resp: 20  Height: 5\' 7"  (1.702 m)  Weight: 148 lb 6.4 oz (67.3 kg)  SpO2: 100%  BMI (Calculated): 23.24    Physical Exam Vitals reviewed.  Constitutional:      General: She is not in acute distress.    Appearance: Normal appearance. She is not ill-appearing.  HENT:     Head: Normocephalic and atraumatic.  Cardiovascular:     Rate and Rhythm: Normal rate and regular rhythm.     Pulses: Normal pulses.     Heart sounds: Normal heart sounds.  Pulmonary:     Effort: Pulmonary effort is normal. No respiratory distress.     Breath sounds: Normal breath sounds. No wheezing, rhonchi or rales.  Skin:    General: Skin is warm and dry.  Neurological:     Mental Status: She is alert and oriented to person, place, and time.  Psychiatric:        Mood and Affect: Mood normal.        Behavior: Behavior normal.        Thought Content: Thought content normal.         Judgment: Judgment normal.   No results found for this or any previous visit (from the past 24 hour(s)).     The 10-year ASCVD risk score (Arnett DK, et al., 2019) is: 0.5%   Values used to calculate the score:     Age: 24 years     Sex: Female     Is Non-Hispanic African American: No     Diabetic: No     Tobacco smoker: No     Systolic Blood Pressure: 96 mmHg     Is BP treated: No     HDL Cholesterol: 63 mg/dL     Total Cholesterol: 228 mg/dL   Assessment & Plan:   1. BMI 23.0-23.9, adult Approximately 60 pound weight loss since 08/2020.  She is currently doing very well and maintaining.  Continue Wegovy 2.4 mg weekly.  Recommend making sure to add strength training into regular exercise at least 3 times weekly.  Continue making healthier choices and try to avoid concentrated sweets/simple carbohydrates when possible. - Semaglutide-Weight Management (WEGOVY) 2.4 MG/0.75ML SOAJ; Inject 2.4 mg into the skin once a week.  Dispense: 9 mL; Refill: 1  Return in about 6 months (around  01/19/2023) for annual physical exam.  ___________________________________________ Thayer Ohm, DNP, APRN, FNP-BC Primary Care and Sports Medicine [facility] [place]

## 2022-07-20 ENCOUNTER — Encounter: Payer: Self-pay | Admitting: Medical-Surgical

## 2022-07-20 ENCOUNTER — Ambulatory Visit (INDEPENDENT_AMBULATORY_CARE_PROVIDER_SITE_OTHER): Payer: BC Managed Care – PPO | Admitting: Medical-Surgical

## 2022-07-20 VITALS — BP 96/63 | HR 77 | Resp 20 | Ht 67.0 in | Wt 148.4 lb

## 2022-07-20 DIAGNOSIS — G4700 Insomnia, unspecified: Secondary | ICD-10-CM

## 2022-07-20 DIAGNOSIS — K219 Gastro-esophageal reflux disease without esophagitis: Secondary | ICD-10-CM

## 2022-07-20 DIAGNOSIS — Z6823 Body mass index (BMI) 23.0-23.9, adult: Secondary | ICD-10-CM | POA: Diagnosis not present

## 2022-07-20 MED ORDER — WEGOVY 2.4 MG/0.75ML ~~LOC~~ SOAJ: 2.4000 mg | SUBCUTANEOUS | 1 refills | Status: DC

## 2022-10-19 LAB — HM COLONOSCOPY

## 2022-10-22 LAB — HM MAMMOGRAPHY

## 2022-10-26 ENCOUNTER — Encounter: Payer: Self-pay | Admitting: Medical-Surgical

## 2022-11-19 ENCOUNTER — Encounter: Payer: Self-pay | Admitting: Medical-Surgical

## 2022-11-22 ENCOUNTER — Encounter: Payer: Self-pay | Admitting: Medical-Surgical

## 2022-11-23 ENCOUNTER — Telehealth: Payer: Self-pay

## 2022-11-23 NOTE — Telephone Encounter (Addendum)
Initiated Prior authorization [id number] 2.'4MG'$ /0.75ML auto-injectors Via: Covermymeds Case/Key:[id number] Status: approved  as of 11/23/22 Reason:Authorization Expiration Date: November 24, 2023. Notified Pt via: Mychart

## 2022-11-25 ENCOUNTER — Other Ambulatory Visit: Payer: Self-pay

## 2022-11-27 ENCOUNTER — Other Ambulatory Visit: Payer: Self-pay

## 2022-11-27 DIAGNOSIS — Z6823 Body mass index (BMI) 23.0-23.9, adult: Secondary | ICD-10-CM

## 2022-11-27 MED ORDER — WEGOVY 2.4 MG/0.75ML ~~LOC~~ SOAJ: 2.4000 mg | SUBCUTANEOUS | 1 refills | Status: DC

## 2022-12-08 ENCOUNTER — Telehealth: Payer: Self-pay

## 2022-12-08 NOTE — Telephone Encounter (Signed)
Initiated Prior authorization [id number] 2.'4MG'$ /0.75ML auto-injectors Via: Covermymeds Case/Key:[id number] Status: approved  as of 12/08/22 Reason:Coverage Start Date:11/08/2022;Coverage End Date:12/08/2023 Notified Pt via: Mychart , called pt

## 2022-12-11 ENCOUNTER — Other Ambulatory Visit: Payer: Self-pay | Admitting: Medical-Surgical

## 2022-12-11 DIAGNOSIS — E282 Polycystic ovarian syndrome: Secondary | ICD-10-CM

## 2022-12-29 ENCOUNTER — Encounter: Payer: Self-pay | Admitting: Medical-Surgical

## 2022-12-30 NOTE — Telephone Encounter (Signed)
Pt called. She stated that she did not get the shipment from [facility] supposedly sent on 3/20

## 2023-01-02 ENCOUNTER — Other Ambulatory Visit: Payer: Self-pay | Admitting: Medical-Surgical

## 2023-01-02 DIAGNOSIS — E282 Polycystic ovarian syndrome: Secondary | ICD-10-CM

## 2023-01-06 ENCOUNTER — Other Ambulatory Visit: Payer: Self-pay | Admitting: Medical-Surgical

## 2023-01-06 DIAGNOSIS — E282 Polycystic ovarian syndrome: Secondary | ICD-10-CM

## 2023-01-19 ENCOUNTER — Encounter: Payer: Self-pay | Admitting: Medical-Surgical

## 2023-01-19 ENCOUNTER — Ambulatory Visit (INDEPENDENT_AMBULATORY_CARE_PROVIDER_SITE_OTHER): Payer: BC Managed Care – PPO | Admitting: Medical-Surgical

## 2023-01-19 VITALS — BP 111/78 | HR 78 | Temp 98.8°F | Ht 67.0 in | Wt 146.4 lb

## 2023-01-19 DIAGNOSIS — Z131 Encounter for screening for diabetes mellitus: Secondary | ICD-10-CM

## 2023-01-19 DIAGNOSIS — Z1329 Encounter for screening for other suspected endocrine disorder: Secondary | ICD-10-CM

## 2023-01-19 DIAGNOSIS — Z0000 Encounter for general adult medical examination without abnormal findings: Secondary | ICD-10-CM | POA: Diagnosis not present

## 2023-01-19 NOTE — Progress Notes (Signed)
Complete physical exam  Patient: Kathleen Ortega   DOB: 09/07/1976   47 y.o. Female  MRN: [id number]  Subjective:    Chief Complaint  Patient presents with   Annual Exam    Kathleen Ortega is a 47 y.o. female who presents today for a complete physical exam. She reports consuming a general diet.  Stays active while at work but no exercise outside of that.  She generally feels well. She reports sleeping well. She does not have additional problems to discuss today.    Most recent fall risk assessment:    05/01/2022    2:46 PM  Fall Risk   Falls in the past year? 0  Number falls in past yr: 0  Injury with Fall? 0  Risk for fall due to : No Fall Risks  Follow up Falls evaluation completed     Most recent depression screenings:    01/19/2023    4:57 PM 07/20/2022    3:42 PM  PHQ 2/9 Scores  PHQ - 2 Score 2 2  PHQ- 9 Score 5 6    Vision:Within last year, Dental: No current dental problems, and STD: The patient denies history of sexually transmitted disease.    Patient Care Team: Christen Butter, NP as PCP - General (Nurse Practitioner)   Outpatient Medications Prior to Visit  Medication Sig   ALPRAZolam (XANAX) 0.5 MG tablet TAKE 1 TABLET(0.5 MG) BY MOUTH TWICE DAILY AS NEEDED FOR ANXIETY   doxepin (SINEQUAN) 10 MG capsule Take 2 capsules (20 mg total) by mouth at bedtime.   hydrocortisone (ANUSOL-HC) 2.5 % rectal cream Place 1 Application rectally 2 (two) times daily.   linaclotide (LINZESS) 145 MCG CAPS capsule Take 1 capsule (145 mcg total) by mouth daily before breakfast.   Multiple Vitamin (MULTIVITAMIN) tablet Take by mouth.   norgestimate-ethinyl estradiol (ORTHO-CYCLEN) 0.25-35 MG-MCG tablet TAKE 1 TABLET BY MOUTH EVERY DAY   omeprazole (PRILOSEC) 40 MG capsule Take 40 mg by mouth 2 (two) times daily.   Semaglutide-Weight Management (WEGOVY) 2.4 MG/0.75ML SOAJ Inject 2.4 mg into the skin once a week.   [DISCONTINUED] pantoprazole (PROTONIX) 40 MG tablet Take 40 mg by  mouth daily.   No facility-administered medications prior to visit.    Review of Systems  Constitutional:  Negative for chills, fever, malaise/fatigue and weight loss.  HENT:  Negative for congestion, ear pain, hearing loss, sinus pain and sore throat.   Eyes:  Negative for blurred vision, photophobia and pain.  Respiratory:  Negative for cough, shortness of breath and wheezing.   Cardiovascular:  Negative for chest pain, palpitations and leg swelling.  Gastrointestinal:  Negative for abdominal pain, constipation, diarrhea, heartburn, nausea and vomiting.  Genitourinary:  Negative for dysuria, frequency and urgency.  Musculoskeletal:  Negative for falls and neck pain.  Skin:  Negative for itching and rash.  Neurological:  Negative for dizziness, weakness and headaches.  Endo/Heme/Allergies:  Negative for polydipsia. Does not bruise/bleed easily.  Psychiatric/Behavioral:  Negative for depression, substance abuse and suicidal ideas. The patient is not nervous/anxious.      Objective:     BP 111/78   Pulse 78   Temp 98.8 F (37.1 C) (Oral)   Ht  (1.702 m)   Wt 146 lb 6.4 oz (66.4 kg)   SpO2 100%   BMI 22.93 kg/m    Physical Exam Constitutional:      General: She is not in acute distress.    Appearance: Normal appearance. She is not  ill-appearing.  HENT:     Head: Normocephalic and atraumatic.     Right Ear: Tympanic membrane, ear canal and external ear normal. There is no impacted cerumen.     Left Ear: Tympanic membrane, ear canal and external ear normal. There is no impacted cerumen.     Nose: Nose normal. No congestion or rhinorrhea.     Mouth/Throat:     Mouth: Mucous membranes are moist.     Pharynx: No oropharyngeal exudate or posterior oropharyngeal erythema.  Eyes:     General: No scleral icterus.       Right eye: No discharge.        Left eye: No discharge.     Extraocular Movements: Extraocular movements intact.     Conjunctiva/sclera: Conjunctivae  normal.     Pupils: Pupils are equal, round, and reactive to light.  Neck:     Thyroid: No thyromegaly.     Vascular: No carotid bruit or JVD.     Trachea: Trachea normal.  Cardiovascular:     Rate and Rhythm: Normal rate and regular rhythm.     Pulses: Normal pulses.     Heart sounds: Normal heart sounds. No murmur heard.    No friction rub. No gallop.  Pulmonary:     Effort: Pulmonary effort is normal. No respiratory distress.     Breath sounds: Normal breath sounds. No wheezing.  Abdominal:     General: Bowel sounds are normal. There is no distension.     Palpations: Abdomen is soft.     Tenderness: There is no abdominal tenderness. There is no guarding.  Musculoskeletal:        General: Normal range of motion.     Cervical back: Normal range of motion and neck supple.  Lymphadenopathy:     Cervical: No cervical adenopathy.  Skin:    General: Skin is warm and dry.  Neurological:     Mental Status: She is alert and oriented to person, place, and time.     Cranial Nerves: No cranial nerve deficit.  Psychiatric:        Mood and Affect: Mood normal.        Behavior: Behavior normal.        Thought Content: Thought content normal.        Judgment: Judgment normal.      No results found for any visits on 01/19/23.     Assessment & Plan:    Routine Health Maintenance and Physical Exam  Immunization History  Administered Date(s) Administered   Influenza, Seasonal, Injecte, Preservative Fre 06/27/2014   Influenza,inj,Quad PF,6+ Mos 06/27/2019   Influenza,inj,quad, With Preservative 07/19/2012, 06/24/2017, 06/29/2018   Influenza,trivalent, recombinat, inj, PF 07/19/2012   Influenza-Unspecified 07/05/2020, 07/17/2021, 07/07/2022   PFIZER(Purple Top)SARS-COV-2 Vaccination 11/15/2019, 12/06/2019, 08/14/2020, 07/05/2021   Tdap 07/07/2011, 04/26/2017    Health Maintenance  Topic Date Due   COVID-19 Vaccine (5 - 2023-24 season) 02/04/2023 (Originally 06/05/2022)   INFLUENZA  VACCINE  05/06/2023   MAMMOGRAM  10/23/2023   DTaP/Tdap/Td (3 - Td or Tdap) 04/27/2027   PAP SMEAR-Modifier  05/02/2027   COLONOSCOPY (Pts 45-21yrs Insurance coverage will need to be confirmed)  10/19/2032   HPV VACCINES  Aged Out   Hepatitis C Screening  Discontinued    Discussed health benefits of physical activity, and encouraged her to engage in regular exercise appropriate for her age and condition.  1. Annual physical exam Checking labs as below.  Up-to-date on preventative care.  Wellness information provided with  AVS. - Lipid panel - COMPLETE METABOLIC PANEL WITH GFR - CBC with Differential/Platelet  2. Diabetes mellitus screening Checking hemoglobin A1c. - Hemoglobin A1c  3. Thyroid disorder screen Checking TSH. - TSH  Return in about 6 months (around 07/21/2023) for chronic disease follow up.   Christen Butter, NP

## 2023-01-25 LAB — CBC WITH DIFFERENTIAL/PLATELET
Basophils Relative: 0.7 %
Eosinophils Absolute: 87 cells/uL (ref 15–500)
MCH: 30.4 pg (ref 27.0–33.0)
Neutrophils Relative %: 63.8 %

## 2023-01-26 LAB — CBC WITH DIFFERENTIAL/PLATELET
Absolute Monocytes: 426 cells/uL (ref 200–950)
Basophils Absolute: 61 cells/uL (ref 0–200)
Eosinophils Relative: 1 %
HCT: 40.6 % (ref 35.0–45.0)
Hemoglobin: 13.7 g/dL (ref 11.7–15.5)
Lymphs Abs: 2575 cells/uL (ref 850–3900)
MCHC: 33.7 g/dL (ref 32.0–36.0)
MCV: 90.2 fL (ref 80.0–100.0)
MPV: 9.7 fL (ref 7.5–12.5)
Monocytes Relative: 4.9 %
Neutro Abs: 5551 cells/uL (ref 1500–7800)
Platelets: 309 10*3/uL (ref 140–400)
RBC: 4.5 10*6/uL (ref 3.80–5.10)
RDW: 11.8 % (ref 11.0–15.0)
Total Lymphocyte: 29.6 %
WBC: 8.7 10*3/uL (ref 3.8–10.8)

## 2023-01-26 LAB — COMPLETE METABOLIC PANEL WITH GFR
AG Ratio: 1.7 (calc) (ref 1.0–2.5)
ALT: 10 U/L (ref 6–29)
AST: 11 U/L (ref 10–35)
Albumin: 4.3 g/dL (ref 3.6–5.1)
Alkaline phosphatase (APISO): 50 U/L (ref 31–125)
BUN: 10 mg/dL (ref 7–25)
CO2: 29 mmol/L (ref 20–32)
Calcium: 9.4 mg/dL (ref 8.6–10.2)
Chloride: 105 mmol/L (ref 98–110)
Creat: 0.76 mg/dL (ref 0.50–0.99)
Globulin: 2.5 g/dL (calc) (ref 1.9–3.7)
Glucose, Bld: 82 mg/dL (ref 65–99)
Potassium: 4.4 mmol/L (ref 3.5–5.3)
Sodium: 140 mmol/L (ref 135–146)
Total Bilirubin: 0.5 mg/dL (ref 0.2–1.2)
Total Protein: 6.8 g/dL (ref 6.1–8.1)
eGFR: 97 mL/min/{1.73_m2} (ref >=60)

## 2023-01-26 LAB — HEMOGLOBIN A1C
Hgb A1c MFr Bld: 4.9 % of total Hgb (ref <5.7)
Mean Plasma Glucose: 94 mg/dL
eAG (mmol/L): 5.2 mmol/L

## 2023-01-26 LAB — LIPID PANEL
Cholesterol: 219 mg/dL — ABNORMAL HIGH (ref <200)
HDL: 79 mg/dL (ref >=50)
LDL Cholesterol (Calc): 122 mg/dL (calc) — ABNORMAL HIGH
Non-HDL Cholesterol (Calc): 140 mg/dL (calc) — ABNORMAL HIGH (ref <130)
Total CHOL/HDL Ratio: 2.8 (calc) (ref <5.0)
Triglycerides: 84 mg/dL (ref <150)

## 2023-01-26 LAB — TSH: TSH: 2.41 mIU/L

## 2023-01-30 ENCOUNTER — Other Ambulatory Visit: Payer: Self-pay | Admitting: Medical-Surgical

## 2023-01-30 DIAGNOSIS — E282 Polycystic ovarian syndrome: Secondary | ICD-10-CM

## 2023-04-07 ENCOUNTER — Ambulatory Visit (INDEPENDENT_AMBULATORY_CARE_PROVIDER_SITE_OTHER): Payer: BC Managed Care – PPO | Admitting: Medical-Surgical

## 2023-04-07 ENCOUNTER — Encounter: Payer: Self-pay | Admitting: Medical-Surgical

## 2023-04-07 VITALS — BP 102/69 | HR 78 | Ht 67.0 in | Wt 148.0 lb

## 2023-04-07 DIAGNOSIS — M25511 Pain in right shoulder: Secondary | ICD-10-CM | POA: Diagnosis not present

## 2023-04-07 MED ORDER — PREDNISONE 50 MG PO TABS: 50.0000 mg | ORAL_TABLET | Freq: Every day | ORAL | 0 refills | Status: DC

## 2023-04-07 NOTE — Progress Notes (Signed)
        Established patient visit  History, exam, impression, and plan:  1. Acute pain of right shoulder Pleasant 47 year old female presenting today with reports of dull aching pain of the right shoulder over the past 3 weeks.  Has some tenderness to the deltoid and the lateral edge of the scapula.  No recent falls or injuries.  Notes that she slept with her arm over her head all night and woke with the pain the next morning.  Has tried ibuprofen, heat, ice, topical ointments, and lidocaine patches with minimal relief of symptoms.  On exam, right upper extremity muscle strength 5/5.  Neurovascularly intact.  Able to reproduce pain with palpation over the posterior shoulder and down into the deltoid.  Full active and passive range of motion of the shoulders bilaterally.  Low suspicion for bursitis or rotator cuff injury.  Consider muscle strain.  Getting x-rays today.  Adding prednisone 50 mg daily x 5 days then resume ibuprofen.  Home exercises printed and provided.  If no improvement with conservative measures noted above in 4-6 weeks, return for further evaluation.   Procedures performed this visit: None.  Return for shoulder pain follow up in 4 weeks if no improvement.  __________________________________ Thayer Ohm, DNP, APRN, FNP-BC Primary Care and Sports Medicine [facility] [place]

## 2023-04-19 ENCOUNTER — Other Ambulatory Visit: Payer: Self-pay | Admitting: Family Medicine

## 2023-04-29 ENCOUNTER — Other Ambulatory Visit: Payer: Self-pay | Admitting: Medical-Surgical

## 2023-06-01 ENCOUNTER — Other Ambulatory Visit: Payer: Self-pay | Admitting: Medical-Surgical

## 2023-06-01 DIAGNOSIS — Z6823 Body mass index (BMI) 23.0-23.9, adult: Secondary | ICD-10-CM

## 2023-07-18 ENCOUNTER — Other Ambulatory Visit: Payer: Self-pay | Admitting: Medical-Surgical

## 2023-07-18 DIAGNOSIS — E282 Polycystic ovarian syndrome: Secondary | ICD-10-CM

## 2023-07-21 ENCOUNTER — Ambulatory Visit (INDEPENDENT_AMBULATORY_CARE_PROVIDER_SITE_OTHER): Payer: BC Managed Care – PPO | Admitting: Medical-Surgical

## 2023-07-21 ENCOUNTER — Encounter: Payer: Self-pay | Admitting: Medical-Surgical

## 2023-07-21 VITALS — BP 106/71 | HR 76 | Resp 20 | Ht 67.0 in | Wt 149.0 lb

## 2023-07-21 DIAGNOSIS — K219 Gastro-esophageal reflux disease without esophagitis: Secondary | ICD-10-CM | POA: Diagnosis not present

## 2023-07-21 DIAGNOSIS — E282 Polycystic ovarian syndrome: Secondary | ICD-10-CM | POA: Diagnosis not present

## 2023-07-21 DIAGNOSIS — F32A Depression, unspecified: Secondary | ICD-10-CM

## 2023-07-21 DIAGNOSIS — G4700 Insomnia, unspecified: Secondary | ICD-10-CM

## 2023-07-21 DIAGNOSIS — Z7689 Persons encountering health services in other specified circumstances: Secondary | ICD-10-CM

## 2023-07-21 DIAGNOSIS — L659 Nonscarring hair loss, unspecified: Secondary | ICD-10-CM

## 2023-07-21 DIAGNOSIS — Z23 Encounter for immunization: Secondary | ICD-10-CM | POA: Diagnosis not present

## 2023-07-21 DIAGNOSIS — Z6823 Body mass index (BMI) 23.0-23.9, adult: Secondary | ICD-10-CM

## 2023-07-21 MED ORDER — WEGOVY 2.4 MG/0.75ML ~~LOC~~ SOAJ
2.4000 mg | SUBCUTANEOUS | 3 refills | Status: DC
Start: 2023-07-21 — End: 2024-06-07

## 2023-07-21 MED ORDER — OMEPRAZOLE 40 MG PO CPDR: 40.0000 mg | DELAYED_RELEASE_CAPSULE | Freq: Every day | ORAL | 3 refills | Status: DC | PRN

## 2023-07-21 MED ORDER — FINASTERIDE 5 MG PO TABS: 2.5000 mg | ORAL_TABLET | Freq: Every day | ORAL | 1 refills | Status: DC

## 2023-07-21 MED ORDER — BUSPIRONE HCL 5 MG PO TABS
5.0000 mg | ORAL_TABLET | Freq: Two times a day (BID) | ORAL | 1 refills | Status: DC
Start: 2023-07-21 — End: 2024-07-19

## 2023-07-21 NOTE — Progress Notes (Signed)
        Established patient visit  History, exam, impression, and plan:  1. Gastroesophageal reflux disease, unspecified whether esophagitis present Pleasant 47 year old female presenting today with a history of GERD.  She previously took omeprazole 40 mg twice daily however has been able to reduce this with well-managed symptoms.  She is using 40 mg once daily on an as-needed basis and feels this keeps her symptoms well-controlled.  Denies any abdominal pain, reflux symptoms, or bowel changes.  Okay to continue omeprazole daily as needed.  2. PCOS (polycystic ovarian syndrome) History of PCOS and is currently taking oral birth control which keeps it well-managed.  Notes that her menstrual cycles are regular on the birth control but they are very very light and short lived.  She is considering coming off the birth control to see how her cycles do now that she is older.  Her husband had a vasectomy so there is no concern for possible pregnancy.  After discussion, okay to discontinue birth control if desired.  If cycles become bothersome again, advised her to let me know and I will be glad to send in refills.  3. Depressive disorder History of depressive order however recently she has been more anxious.  She has a daughter who is type I diabetic that recently went off to college.  This is causing some significant stress in addition to regular work concerns.  She is taking doxepin 20 mg nightly which was originally prescribed by gastroenterology to help manage her GI symptoms.  Feels that the medication is still somewhat effective however the anxiety is breaking through.  She is interested in additional options but does not have time for therapy.  Starting BuSpar 5-15 mg twice daily to aid with anxiety.  Advised her to let me know if this is helpful and if not we can certainly look at switching up to something different.  4. Insomnia, unspecified type Taking doxepin as noted above.  Also using melatonin  which is helpful at times.  Continue both medications as desired.  5. Encounter for weight management Has been on Wegovy 2.4 mg weekly and doing well.  That she is at the maintenance dose and has been able to maintain her weight loss.  Refilling today.  6. Hair loss Has noted increasing hair loss and reports that her hairdresser has seen this to.  She is wondering what the cause might be.  Labs checked in April did not show any contributing metabolic factors.  Plan to check iron, vitamin D, and vitamin B12.  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  Advised her to look over the counter for minoxidil but finasteride sent to the pharmacy for her. - Vitamin B12 - VITAMIN D 25 Hydroxy (Vit-D Deficiency, Fractures) - Iron, TIBC and Ferritin Panel  Procedures performed this visit: None.  Return in about 6 months (around 01/19/2024) for chronic disease follow up.  __________________________________ Thayer Ohm, DNP, APRN, FNP-BC Primary Care and Sports Medicine [facility] [place]

## 2023-08-12 ENCOUNTER — Other Ambulatory Visit: Payer: Self-pay | Admitting: Medical-Surgical

## 2023-12-07 ENCOUNTER — Encounter: Payer: Self-pay | Admitting: Medical-Surgical

## 2023-12-07 MED ORDER — ALPRAZOLAM 0.5 MG PO TABS: 0.5000 mg | ORAL_TABLET | Freq: Every day | ORAL | 0 refills | Status: AC | PRN

## 2023-12-30 ENCOUNTER — Encounter: Payer: Self-pay | Admitting: Medical-Surgical

## 2023-12-31 ENCOUNTER — Telehealth: Payer: Self-pay

## 2023-12-31 ENCOUNTER — Other Ambulatory Visit (HOSPITAL_COMMUNITY): Payer: Self-pay

## 2023-12-31 NOTE — Telephone Encounter (Signed)
 Pharmacy Patient Advocate Encounter  Received notification from [facility] that Prior Authorization for Wegovy 2.4 has been APPROVED from 12/01/23 to 12/30/24. Unable to obtain price due to refill too soon rejection, last fill date 12/25/23 next available fill date5/23/25   PA #/Case ID/Reference #: [id number]

## 2023-12-31 NOTE — Telephone Encounter (Signed)
 PA request has been Approved. New Encounter has been or will be created for follow up. For additional info see Pharmacy Prior Auth telephone encounter from 12/31/2023.

## 2023-12-31 NOTE — Telephone Encounter (Signed)
error 

## 2023-12-31 NOTE — Telephone Encounter (Signed)
 Pharmacy Patient Advocate Encounter   Received notification from Onbase that prior authorization for Wegovy 2.4 mg/ 0.75 ml auto injector is required/requested.   Insurance verification completed.   The patient is insured through [facility] .   Per test claim: PA required; PA submitted to above mentioned insurance via Fax Key/confirmation #/EOC -- Status is pending

## 2024-01-04 ENCOUNTER — Other Ambulatory Visit: Payer: Self-pay | Admitting: Medical-Surgical

## 2024-01-04 DIAGNOSIS — E282 Polycystic ovarian syndrome: Secondary | ICD-10-CM

## 2024-01-19 ENCOUNTER — Ambulatory Visit (INDEPENDENT_AMBULATORY_CARE_PROVIDER_SITE_OTHER): Payer: BC Managed Care – PPO | Admitting: Medical-Surgical

## 2024-01-19 ENCOUNTER — Encounter: Payer: Self-pay | Admitting: Medical-Surgical

## 2024-01-19 VITALS — BP 107/65 | HR 77 | Ht 67.0 in | Wt 150.5 lb

## 2024-01-19 DIAGNOSIS — N926 Irregular menstruation, unspecified: Secondary | ICD-10-CM | POA: Diagnosis not present

## 2024-01-19 DIAGNOSIS — Z0000 Encounter for general adult medical examination without abnormal findings: Secondary | ICD-10-CM

## 2024-01-19 DIAGNOSIS — G4700 Insomnia, unspecified: Secondary | ICD-10-CM | POA: Diagnosis not present

## 2024-01-19 DIAGNOSIS — K219 Gastro-esophageal reflux disease without esophagitis: Secondary | ICD-10-CM | POA: Diagnosis not present

## 2024-01-19 DIAGNOSIS — F32A Depression, unspecified: Secondary | ICD-10-CM | POA: Diagnosis not present

## 2024-01-19 DIAGNOSIS — M25552 Pain in left hip: Secondary | ICD-10-CM

## 2024-01-19 DIAGNOSIS — L659 Nonscarring hair loss, unspecified: Secondary | ICD-10-CM

## 2024-01-19 MED ORDER — FINASTERIDE 5 MG PO TABS: 2.5000 mg | ORAL_TABLET | Freq: Every day | ORAL | 1 refills | Status: DC

## 2024-01-19 MED ORDER — CYCLOBENZAPRINE HCL 10 MG PO TABS: 5.0000 mg | ORAL_TABLET | Freq: Three times a day (TID) | ORAL | 1 refills | Status: DC | PRN

## 2024-01-19 NOTE — Progress Notes (Signed)
        Established patient visit  History, exam, impression, and plan:  1. Depressive disorder (Primary) Pleasant 48 year old female presenting today for follow up for depression. She is currently taking Doxepin 20mg  nightly and using Xanax only for rare situations such as flying. Never started Buspar but has it at home. Feels that her mood is overall stable and denies SI/HI. Not interested in any changes in medication at this time. Continue Doxepin nightly as prescribed.   2. Insomnia, unspecified type Has been using Doxepin to help sleep but it is only moderately effective. Still struggles with restful sleep at times. Relates this is most recently because of a pain/discomfort that she is having in her left hip/thigh. She has been through a full evaluation for it but no cause could be identified. Continue Doxepin. Adding Flexeril as needed for muscle spasm.   3. Gastroesophageal reflux disease, unspecified whether esophagitis present Taking Omeprazole 40mg  daily as needed. Reports only using this when she has flares of GI symptoms rather than daily. Has found that taking it daily makes her constipation worse. The medication works well for her. She has also been working to avoid known food triggers. Contiue Omeprazole 40mg  daily prn.   4. Irregular periods/menstrual cycles Hx of PCOS previously on birth control pills for period management. Made the choice to stop the birth control about 5 weeks ago to see if she is still having periods and if things have changed with age. If anything changes, she will let me know.   5. Healthcare maintenance Updating preventative care labs today.  - CBC with Differential/Platelet - CMP14+EGFR - Lipid panel  6. Left hip pain As noted above, full workup has been done for this chronic issue without a cause found. Adding PRN Flexeril TID.   7. Hair loss Has been taking Finasteride 2.5mg  daily for about 6 months, tolerating well without side effects. Has not  been able to tell much difference yet but her hairstylist told her at her last visit that she was seeing new hair growth. Happy with the medication and would like to continue. Refilling today.   Procedures performed this visit: None.  Return in about 6 months (around 07/20/2024) for chronic disease follow up.  __________________________________ Maryl Snook, DNP, APRN, FNP-BC Primary Care and Sports Medicine [facility] [place]

## 2024-01-20 ENCOUNTER — Encounter: Payer: Self-pay | Admitting: Medical-Surgical

## 2024-01-20 LAB — CMP14+EGFR
ALT: 13 IU/L (ref 0–32)
AST: 15 IU/L (ref 0–40)
Albumin: 4.3 g/dL (ref 3.9–4.9)
Alkaline Phosphatase: 70 IU/L (ref 44–121)
BUN/Creatinine Ratio: 20 (ref 9–23)
BUN: 14 mg/dL (ref 6–24)
Bilirubin Total: 0.4 mg/dL (ref 0.0–1.2)
CO2: 23 mmol/L (ref 20–29)
Calcium: 9.4 mg/dL (ref 8.7–10.2)
Chloride: 105 mmol/L (ref 96–106)
Creatinine, Ser: 0.71 mg/dL (ref 0.57–1.00)
Globulin, Total: 2.1 g/dL (ref 1.5–4.5)
Glucose: 89 mg/dL (ref 70–99)
Potassium: 4.5 mmol/L (ref 3.5–5.2)
Sodium: 141 mmol/L (ref 134–144)
Total Protein: 6.4 g/dL (ref 6.0–8.5)
eGFR: 105 mL/min/{1.73_m2} (ref >59)

## 2024-01-20 LAB — CBC WITH DIFFERENTIAL/PLATELET
Basophils Absolute: 0.1 10*3/uL (ref 0.0–0.2)
Basos: 1 %
EOS (ABSOLUTE): 0.1 10*3/uL (ref 0.0–0.4)
Eos: 1 %
Hematocrit: 43.4 % (ref 34.0–46.6)
Hemoglobin: 14.3 g/dL (ref 11.1–15.9)
Immature Grans (Abs): 0 10*3/uL (ref 0.0–0.1)
Immature Granulocytes: 0 %
Lymphocytes Absolute: 2.7 10*3/uL (ref 0.7–3.1)
Lymphs: 32 %
MCH: 29.8 pg (ref 26.6–33.0)
MCHC: 32.9 g/dL (ref 31.5–35.7)
MCV: 90 fL (ref 79–97)
Monocytes Absolute: 0.7 10*3/uL (ref 0.1–0.9)
Monocytes: 8 %
Neutrophils Absolute: 4.8 10*3/uL (ref 1.4–7.0)
Neutrophils: 58 %
Platelets: 355 10*3/uL (ref 150–450)
RBC: 4.8 x10E6/uL (ref 3.77–5.28)
RDW: 11.9 % (ref 11.7–15.4)
WBC: 8.4 10*3/uL (ref 3.4–10.8)

## 2024-01-20 LAB — LIPID PANEL
Chol/HDL Ratio: 2.9 ratio (ref 0.0–4.4)
Cholesterol, Total: 200 mg/dL — ABNORMAL HIGH (ref 100–199)
HDL: 69 mg/dL (ref >39)
LDL Chol Calc (NIH): 121 mg/dL — ABNORMAL HIGH (ref 0–99)
Triglycerides: 53 mg/dL (ref 0–149)
VLDL Cholesterol Cal: 10 mg/dL (ref 5–40)

## 2024-02-08 LAB — HM MAMMOGRAPHY

## 2024-03-18 ENCOUNTER — Other Ambulatory Visit: Payer: Self-pay | Admitting: Medical-Surgical

## 2024-05-03 ENCOUNTER — Other Ambulatory Visit: Payer: Self-pay | Admitting: Medical-Surgical

## 2024-06-05 ENCOUNTER — Other Ambulatory Visit: Payer: Self-pay | Admitting: Medical-Surgical

## 2024-06-05 DIAGNOSIS — Z6823 Body mass index (BMI) 23.0-23.9, adult: Secondary | ICD-10-CM

## 2024-06-06 ENCOUNTER — Encounter: Payer: Self-pay | Admitting: Sports Medicine

## 2024-06-07 ENCOUNTER — Other Ambulatory Visit: Payer: Self-pay

## 2024-06-07 DIAGNOSIS — Z6823 Body mass index (BMI) 23.0-23.9, adult: Secondary | ICD-10-CM

## 2024-06-08 ENCOUNTER — Encounter: Payer: Self-pay | Admitting: Medical-Surgical

## 2024-06-08 ENCOUNTER — Other Ambulatory Visit: Payer: Self-pay

## 2024-06-08 DIAGNOSIS — Z6823 Body mass index (BMI) 23.0-23.9, adult: Secondary | ICD-10-CM

## 2024-06-08 MED ORDER — WEGOVY 2.4 MG/0.75ML ~~LOC~~ SOAJ: 2.4000 mg | SUBCUTANEOUS | 0 refills | Status: DC

## 2024-06-08 NOTE — Telephone Encounter (Signed)
 Just FYI Moved [facility]  prescription [facility]. Sent in to change of pharmacy today

## 2024-07-11 ENCOUNTER — Other Ambulatory Visit: Payer: Self-pay | Admitting: Medical-Surgical

## 2024-07-19 NOTE — Progress Notes (Unsigned)
 Established patient visit   History of Present Illness   Discussed the use of AI scribe software for clinical note transcription with the patient, who gave verbal consent to proceed.  History of Present Illness   Kathleen Ortega is a 48 year old female who presents for a six-month follow-up visit.  Left upper extremity pain - Left lateral hand pain along the fifth digit and into the lateral wrist for two weeks - Initially achy and localized to the lateral aspect of the arm - Currently a burning sensation affecting the arm from the elbow to the tip of the fifth finger. - No impairment of hand movement - Pain causes discomfort - Suspects leaning on elbow or sleeping position as contributing factors - Arthritis cream, Tylenol, and ibuprofen have not provided significant relief  Perimenopausal vasomotor symptoms - Single hot flash after eating shellfish  Weight gain - Weight increased by approximately ten pounds - Attributes weight gain to perimenopause - Continues Wegovy  2.4 mg weekly  Depressive disorder - Endorses everyday stress especially with work -Feels that she has increased irritability secondary to perimenopausal changes - Taking doxepin  20 mg at bedtime and using BuSpar  on a as needed basis - Has used Xanax  very sparingly in the past to help with severe anxiety not responsive to other measures.  GERD - Uses Prilosec on an as needed basis - Has occasional flares that can last for couple of weeks prompting her to restart the medication  Hair loss - Doing well overall on finasteride  2.5 mg daily, no side effects - Currently happy with results    Physical Exam   Physical Exam Vitals reviewed.  Constitutional:      General: She is not in acute distress.    Appearance: Normal appearance. She is not ill-appearing.  HENT:     Head: Normocephalic and atraumatic.  Cardiovascular:     Rate and Rhythm: Normal rate and regular rhythm.     Pulses: Normal pulses.      Heart sounds: Normal heart sounds. No murmur heard.    No friction rub. No gallop.  Pulmonary:     Effort: Pulmonary effort is normal. No respiratory distress.     Breath sounds: Normal breath sounds. No wheezing.  Skin:    General: Skin is warm and dry.  Neurological:     Mental Status: She is alert and oriented to person, place, and time.  Psychiatric:        Mood and Affect: Mood normal.        Behavior: Behavior normal.        Thought Content: Thought content normal.        Judgment: Judgment normal.    Assessment & Plan     Left ulnar neuropathy Left ulnar neuropathy with burning and aching pain due to pressure on the ulnar nerve. - Recommend elbow sleeve with padding. - Advise against leaning on elbow and sleeping with arm tightly curled. - Start prednisone  50mg  daily x 5 days. - Instruct to take ibuprofen 800 mg TID or Aleve BID post steroid completion.  Left hip pain Left hip pain radiating from hip down the leg, suggestive of sciatic nerve involvement. - Refer to physical therapy. - Prescribe burst of steroids.  Perimenopausal symptoms Perimenopausal symptoms including weight gain and mood irritability. Has had a single hot flash. - Discussed treatment options, declined at this time.  - Discussed lifestyle modifications and supplements.  Obesity, currently managed with anti-obesity medication  Obesity managed with Wegovy . - Initial BMI 35.7 with weight of 208lbs - Initiated Wegovy  in 09/2020 - Today's BMI 26.06 with weight of 153lbs - Following a low calorie diet and exercising regularly. - Continue Wegovy  2.4mg  weekly - Will not be treated with other GLP-1s - If Wegovy  no longer covered, plan to switch to compounded tirzepatide.  Elevated LDL cholesterol Prior LDL elevated managed with fiber pills, exercise, and weight loss. - Order blood work to recheck cholesterol levels.  Gastroesophageal reflux disease (GERD) GERD managed with Prilosec 40mg  daily as  needed, with occasional symptom flare-ups. - Continue Prilosec as needed.  Depression/anxiety Depression with mood irritability and occupational stress, managed with doxepin  for sleep. - Continue doxepin  20 mg at bedtime. - Discussed situational irritability and occupational stress.     Hair loss Managed well with finasteride  2.5mg  daily, no SE.  - Continue finasteride  as prescribed.  Follow up   Return in about 6 months (around 01/18/2025) for chronic disease follow up. __________________________________ Zada FREDRIK Palin, DNP, APRN, FNP-BC Primary Care and Sports Medicine [facility] [place]

## 2024-07-20 ENCOUNTER — Encounter: Payer: Self-pay | Admitting: Medical-Surgical

## 2024-07-20 ENCOUNTER — Other Ambulatory Visit (HOSPITAL_COMMUNITY): Payer: Self-pay

## 2024-07-20 ENCOUNTER — Ambulatory Visit: Admitting: Medical-Surgical

## 2024-07-20 VITALS — BP 95/65 | HR 79 | Resp 20 | Ht 64.25 in | Wt 153.0 lb

## 2024-07-20 DIAGNOSIS — L659 Nonscarring hair loss, unspecified: Secondary | ICD-10-CM

## 2024-07-20 DIAGNOSIS — F32A Depression, unspecified: Secondary | ICD-10-CM

## 2024-07-20 DIAGNOSIS — Z6826 Body mass index (BMI) 26.0-26.9, adult: Secondary | ICD-10-CM

## 2024-07-20 DIAGNOSIS — M25552 Pain in left hip: Secondary | ICD-10-CM | POA: Diagnosis not present

## 2024-07-20 DIAGNOSIS — G5622 Lesion of ulnar nerve, left upper limb: Secondary | ICD-10-CM

## 2024-07-20 DIAGNOSIS — Z6823 Body mass index (BMI) 23.0-23.9, adult: Secondary | ICD-10-CM

## 2024-07-20 DIAGNOSIS — F5101 Primary insomnia: Secondary | ICD-10-CM

## 2024-07-20 DIAGNOSIS — K219 Gastro-esophageal reflux disease without esophagitis: Secondary | ICD-10-CM | POA: Diagnosis not present

## 2024-07-20 DIAGNOSIS — F419 Anxiety disorder, unspecified: Secondary | ICD-10-CM

## 2024-07-20 DIAGNOSIS — E7800 Pure hypercholesterolemia, unspecified: Secondary | ICD-10-CM

## 2024-07-20 DIAGNOSIS — E663 Overweight: Secondary | ICD-10-CM

## 2024-07-20 MED ORDER — WEGOVY 2.4 MG/0.75ML ~~LOC~~ SOAJ: 2.4000 mg | SUBCUTANEOUS | 0 refills | Status: DC

## 2024-07-20 MED ORDER — OMEPRAZOLE 40 MG PO CPDR: 40.0000 mg | DELAYED_RELEASE_CAPSULE | Freq: Every day | ORAL | 3 refills | Status: AC | PRN

## 2024-07-20 MED ORDER — BUSPIRONE HCL 5 MG PO TABS
5.0000 mg | ORAL_TABLET | Freq: Two times a day (BID) | ORAL | 1 refills | Status: DC
Start: 2024-07-20 — End: 2024-08-13

## 2024-07-20 MED ORDER — FINASTERIDE 5 MG PO TABS: 2.5000 mg | ORAL_TABLET | Freq: Every day | ORAL | 1 refills | Status: AC

## 2024-07-20 MED ORDER — CYCLOBENZAPRINE HCL 10 MG PO TABS: ORAL_TABLET | ORAL | 1 refills | Status: AC

## 2024-07-20 MED ORDER — PREDNISONE 50 MG PO TABS: 50.0000 mg | ORAL_TABLET | Freq: Every day | ORAL | 0 refills | Status: AC

## 2024-07-20 MED ORDER — DOXEPIN HCL 10 MG PO CAPS: 20.0000 mg | ORAL_CAPSULE | Freq: Every day | ORAL | 0 refills | Status: AC

## 2024-07-20 NOTE — Telephone Encounter (Signed)
 Contacted [facility] who handles the authorization for the patient. As per the insurance - the current prior authorization on file is no longer valid. New prior auth for Wegovy  is required. Thanks in advance.

## 2024-08-11 ENCOUNTER — Other Ambulatory Visit: Payer: Self-pay | Admitting: Medical-Surgical

## 2024-08-11 DIAGNOSIS — F419 Anxiety disorder, unspecified: Secondary | ICD-10-CM

## 2024-08-11 NOTE — Telephone Encounter (Signed)
 Requesting 540 tablets for 90 days supply

## 2024-08-16 ENCOUNTER — Ambulatory Visit: Payer: Self-pay | Admitting: Medical-Surgical

## 2024-08-16 LAB — LIPID PANEL
Chol/HDL Ratio: 2.4 ratio (ref 0.0–4.4)
Cholesterol, Total: 170 mg/dL (ref 100–199)
HDL: 70 mg/dL (ref >39)
LDL Chol Calc (NIH): 91 mg/dL (ref 0–99)
Triglycerides: 44 mg/dL (ref 0–149)
VLDL Cholesterol Cal: 9 mg/dL (ref 5–40)

## 2024-08-21 ENCOUNTER — Other Ambulatory Visit: Payer: Self-pay | Admitting: Medical-Surgical

## 2024-08-21 DIAGNOSIS — E663 Overweight: Secondary | ICD-10-CM

## 2024-08-24 ENCOUNTER — Encounter: Payer: Self-pay | Admitting: Medical-Surgical

## 2024-08-24 DIAGNOSIS — E663 Overweight: Secondary | ICD-10-CM

## 2024-08-24 NOTE — Addendum Note (Signed)
 Addended by: Claressa Hughley P on: 08/24/2024 03:02 PM   Modules accepted: Orders

## 2024-08-24 NOTE — Telephone Encounter (Signed)
 Patient requesting rx rf of Wegovy  2.4mg  to [facility]. States this was denied by provider Last written 07/20/2024 as 84 day supply to   [id number] [place], [place] - [address] [place], [place] [address] Phone: [phone]    Spoke with patient and informed of above  Patient is [facility] that [facility] and [facility].  [facility]  Last OV 07/20/2024 Upcoming appt 01/19/2025

## 2024-08-25 MED ORDER — WEGOVY 2.4 MG/0.75ML ~~LOC~~ SOAJ: 2.4000 mg | SUBCUTANEOUS | 1 refills | Status: AC

## 2024-09-19 ENCOUNTER — Encounter: Payer: Self-pay | Admitting: Medical-Surgical

## 2024-09-19 DIAGNOSIS — N200 Calculus of kidney: Secondary | ICD-10-CM

## 2024-09-19 MED ORDER — TAMSULOSIN HCL 0.4 MG PO CAPS: 0.4000 mg | ORAL_CAPSULE | Freq: Every day | ORAL | 3 refills | Status: AC

## 2024-11-08 ENCOUNTER — Encounter: Payer: Self-pay | Admitting: Medical-Surgical

## 2025-01-19 ENCOUNTER — Ambulatory Visit: Admitting: Medical-Surgical
